# Patient Record
Sex: Male | Born: 1970
Health system: Southern US, Community
[De-identification: ages and names within clinical notes are randomized; demographics above are authoritative.]

## PROBLEM LIST (undated history)

## (undated) DIAGNOSIS — D693 Immune thrombocytopenic purpura: Secondary | ICD-10-CM

## (undated) DIAGNOSIS — T7840XA Allergy, unspecified, initial encounter: Secondary | ICD-10-CM

## (undated) DIAGNOSIS — C801 Malignant (primary) neoplasm, unspecified: Secondary | ICD-10-CM

## (undated) DIAGNOSIS — I10 Essential (primary) hypertension: Secondary | ICD-10-CM

## (undated) HISTORY — PX: DEEP NECK LYMPH NODE BIOPSY / EXCISION: SUR126

## (undated) HISTORY — PX: EXCISION OF TONGUE LESION: SHX6434

## (undated) HISTORY — DX: Immune thrombocytopenic purpura: D69.3

## (undated) HISTORY — DX: Essential (primary) hypertension: I10

## (undated) HISTORY — DX: Allergy, unspecified, initial encounter: T78.40XA

---

## 1998-09-07 DIAGNOSIS — D693 Immune thrombocytopenic purpura: Secondary | ICD-10-CM

## 1998-09-07 HISTORY — DX: Immune thrombocytopenic purpura: D69.3

## 2001-08-24 ENCOUNTER — Encounter: Admission: RE | Admit: 2001-08-24 | Discharge: 2001-08-24 | Payer: Self-pay | Admitting: Oncology

## 2001-08-24 ENCOUNTER — Encounter (HOSPITAL_COMMUNITY): Admission: RE | Admit: 2001-08-24 | Discharge: 2001-09-23 | Payer: Self-pay | Admitting: Oncology

## 2003-02-22 ENCOUNTER — Encounter (HOSPITAL_COMMUNITY): Admission: RE | Admit: 2003-02-22 | Discharge: 2003-03-24 | Payer: Self-pay | Admitting: Oncology

## 2003-02-22 ENCOUNTER — Encounter: Admission: RE | Admit: 2003-02-22 | Discharge: 2003-02-22 | Payer: Self-pay | Admitting: Oncology

## 2004-02-22 ENCOUNTER — Encounter: Admission: RE | Admit: 2004-02-22 | Discharge: 2004-02-22 | Payer: Self-pay | Admitting: Oncology

## 2004-02-22 ENCOUNTER — Encounter (HOSPITAL_COMMUNITY): Admission: RE | Admit: 2004-02-22 | Discharge: 2004-03-23 | Payer: Self-pay | Admitting: Oncology

## 2007-05-12 ENCOUNTER — Other Ambulatory Visit: Admission: RE | Admit: 2007-05-12 | Discharge: 2007-05-12 | Payer: Self-pay | Admitting: Urology

## 2007-05-12 ENCOUNTER — Encounter (INDEPENDENT_AMBULATORY_CARE_PROVIDER_SITE_OTHER): Payer: Self-pay | Admitting: Urology

## 2013-01-07 ENCOUNTER — Encounter: Payer: Self-pay | Admitting: *Deleted

## 2013-05-30 ENCOUNTER — Other Ambulatory Visit: Payer: Self-pay | Admitting: Family Medicine

## 2013-06-28 ENCOUNTER — Encounter: Payer: Self-pay | Admitting: Family Medicine

## 2013-06-28 ENCOUNTER — Ambulatory Visit (INDEPENDENT_AMBULATORY_CARE_PROVIDER_SITE_OTHER): Payer: BC Managed Care – PPO | Admitting: Family Medicine

## 2013-06-28 VITALS — BP 130/98 | Ht 78.0 in | Wt 227.0 lb

## 2013-06-28 DIAGNOSIS — Z23 Encounter for immunization: Secondary | ICD-10-CM

## 2013-06-28 DIAGNOSIS — Z0000 Encounter for general adult medical examination without abnormal findings: Secondary | ICD-10-CM

## 2013-06-28 DIAGNOSIS — F17200 Nicotine dependence, unspecified, uncomplicated: Secondary | ICD-10-CM

## 2013-06-28 DIAGNOSIS — I10 Essential (primary) hypertension: Secondary | ICD-10-CM

## 2013-06-28 DIAGNOSIS — E785 Hyperlipidemia, unspecified: Secondary | ICD-10-CM

## 2013-06-28 DIAGNOSIS — N529 Male erectile dysfunction, unspecified: Secondary | ICD-10-CM

## 2013-06-28 DIAGNOSIS — R739 Hyperglycemia, unspecified: Secondary | ICD-10-CM

## 2013-06-28 DIAGNOSIS — Z720 Tobacco use: Secondary | ICD-10-CM

## 2013-06-28 DIAGNOSIS — R7309 Other abnormal glucose: Secondary | ICD-10-CM

## 2013-06-28 LAB — BASIC METABOLIC PANEL
BUN: 13 mg/dL (ref 6–23)
CO2: 27 mEq/L (ref 19–32)
Glucose, Bld: 104 mg/dL — ABNORMAL HIGH (ref 70–99)
Potassium: 4.6 mEq/L (ref 3.5–5.3)
Sodium: 138 mEq/L (ref 135–145)

## 2013-06-28 LAB — HEPATIC FUNCTION PANEL
AST: 20 U/L (ref 0–37)
Bilirubin, Direct: 0.2 mg/dL (ref 0.0–0.3)
Indirect Bilirubin: 0.8 mg/dL (ref 0.0–0.9)
Total Bilirubin: 1 mg/dL (ref 0.3–1.2)

## 2013-06-28 LAB — LIPID PANEL: Total CHOL/HDL Ratio: 4.6 Ratio

## 2013-06-28 MED ORDER — SILDENAFIL CITRATE 100 MG PO TABS: 100.0000 mg | ORAL_TABLET | ORAL | Status: DC | PRN

## 2013-06-28 MED ORDER — LISINOPRIL 10 MG PO TABS: ORAL_TABLET | ORAL | Status: DC

## 2013-06-28 NOTE — Progress Notes (Signed)
  Subjective:    Patient ID: Christopher Buckley, male    DOB: 02/14/1971, 42 y.o.   MRN: [id number]  HPI Patient is here today for a physical.  He needs blood work done for his work physical.  He is unsure about getting the flu vaccine (he has never had it before). The patient comes in today for a wellness visit.  A review of their health history was completed.  A review of medications was also completed. Any necessary refills were discussed. Sensible healthy diet was discussed. Importance of minimizing excessive salt and carbohydrates was also discussed. Safety was stressed including driving, activities at work and at home where applicable. Importance of regular physical activity for overall health was discussed. Preventative measures appropriate for age were discussed. Time was spent with the patient discussing any concerns they have about their well-being.  No concerns.     Review of Systems  Constitutional: Negative for fever, activity change and appetite change.  HENT: Negative for congestion and rhinorrhea.   Eyes: Negative for discharge.  Respiratory: Negative for cough and wheezing.   Cardiovascular: Negative for chest pain.  Gastrointestinal: Negative for vomiting, abdominal pain and blood in stool.  Genitourinary: Negative for frequency and difficulty urinating.  Musculoskeletal: Negative for neck pain.  Skin: Negative for rash.  Allergic/Immunologic: Negative for environmental allergies and food allergies.  Neurological: Negative for weakness and headaches.  Psychiatric/Behavioral: Negative for agitation.       Objective:   Physical Exam  Constitutional: He appears well-developed and well-nourished.  HENT:  Head: Normocephalic and atraumatic.  Right Ear: External ear normal.  Left Ear: External ear normal.  Nose: Nose normal.  Mouth/Throat: Oropharynx is clear and moist.  Eyes: EOM are normal. Pupils are equal, round, and reactive to light.  Neck: Normal range  of motion. Neck supple. No thyromegaly present.  Cardiovascular: Normal rate, regular rhythm and normal heart sounds.   No murmur heard. Pulmonary/Chest: Effort normal and breath sounds normal. No respiratory distress. He has no wheezes.  Abdominal: Soft. Bowel sounds are normal. He exhibits no distension and no mass. There is no tenderness.  Genitourinary: Penis normal.  Musculoskeletal: Normal range of motion. He exhibits no edema.  Lymphadenopathy:    He has no cervical adenopathy.  Neurological: He is alert. He exhibits normal muscle tone.  Skin: Skin is warm and dry. No erythema.  Psychiatric: He has a normal mood and affect. His behavior is normal. Judgment normal.          Assessment & Plan:  #1 wellness-patient advised to quit smoking should he is encouraged to exercise on regular basis bring his weight down a little bit. Also keep taking his blood pressure medicine. #2 erectile dysfunction check testosterone level today. Also try Viagra #3 wellness labs ordered. #4 whitecoat hypertension his readings at home are very good. We will fill out his form accordingly. Best blood pressure reading here 130/92

## 2013-06-28 NOTE — Patient Instructions (Signed)
DASH Diet  The DASH diet stands for "Dietary Approaches to Stop Hypertension." It is a healthy eating plan that has been shown to reduce high blood pressure (hypertension) in as little as 14 days, while also possibly providing other significant health benefits. These other health benefits include reducing the risk of breast cancer after menopause and reducing the risk of type 2 diabetes, heart disease, colon cancer, and stroke. Health benefits also include weight loss and slowing kidney failure in patients with chronic kidney disease.   DIET GUIDELINES  · Limit salt (sodium). Your diet should contain less than 1500 mg of sodium daily.  · Limit refined or processed carbohydrates. Your diet should include mostly whole grains. Desserts and added sugars should be used sparingly.  · Include small amounts of heart-healthy fats. These types of fats include nuts, oils, and tub margarine. Limit saturated and trans fats. These fats have been shown to be harmful in the body.  CHOOSING FOODS   The following food groups are based on a 2000 calorie diet. See your Registered Dietitian for individual calorie needs.  Grains and Grain Products (6 to 8 servings daily)  · Eat More Often: Whole-wheat bread, brown rice, whole-grain or wheat pasta, quinoa, popcorn without added fat or salt (air popped).  · Eat Less Often: White bread, white pasta, white rice, cornbread.  Vegetables (4 to 5 servings daily)  · Eat More Often: Fresh, frozen, and canned vegetables. Vegetables may be raw, steamed, roasted, or grilled with a minimal amount of fat.  · Eat Less Often/Avoid: Creamed or fried vegetables. Vegetables in a cheese sauce.  Fruit (4 to 5 servings daily)  · Eat More Often: All fresh, canned (in natural juice), or frozen fruits. Dried fruits without added sugar. One hundred percent fruit juice (½ cup [237 mL] daily).  · Eat Less Often: Dried fruits with added sugar. Canned fruit in light or heavy syrup.  Lean Meats, Fish, and Poultry (2  servings or less daily. One serving is 3 to 4 oz [85-114 g]).  · Eat More Often: Ninety percent or leaner ground beef, tenderloin, sirloin. Round cuts of beef, chicken breast, turkey breast. All fish. Grill, bake, or broil your meat. Nothing should be fried.  · Eat Less Often/Avoid: Fatty cuts of meat, turkey, or chicken leg, thigh, or wing. Fried cuts of meat or fish.  Dairy (2 to 3 servings)  · Eat More Often: Low-fat or fat-free milk, low-fat plain or light yogurt, reduced-fat or part-skim cheese.  · Eat Less Often/Avoid: Milk (whole, 2%). Whole milk yogurt. Full-fat cheeses.  Nuts, Seeds, and Legumes (4 to 5 servings per week)  · Eat More Often: All without added salt.  · Eat Less Often/Avoid: Salted nuts and seeds, canned beans with added salt.  Fats and Sweets (limited)  · Eat More Often: Vegetable oils, tub margarines without trans fats, sugar-free gelatin. Mayonnaise and salad dressings.  · Eat Less Often/Avoid: Coconut oils, palm oils, butter, stick margarine, cream, half and half, cookies, candy, pie.  FOR MORE INFORMATION  The Dash Diet Eating Plan: www.dashdiet.org  Document Released: 08/13/2011 Document Revised: 11/16/2011 Document Reviewed: 08/13/2011  ExitCare® Patient Information ©2014 [employer].

## 2013-06-29 ENCOUNTER — Encounter: Payer: Self-pay | Admitting: Family Medicine

## 2014-06-08 ENCOUNTER — Other Ambulatory Visit: Payer: Self-pay | Admitting: Family Medicine

## 2014-06-26 ENCOUNTER — Encounter: Payer: Self-pay | Admitting: Family Medicine

## 2014-06-26 ENCOUNTER — Ambulatory Visit (INDEPENDENT_AMBULATORY_CARE_PROVIDER_SITE_OTHER): Payer: BC Managed Care – PPO | Admitting: Family Medicine

## 2014-06-26 VITALS — BP 140/90 | Ht 77.75 in | Wt 230.2 lb

## 2014-06-26 DIAGNOSIS — Z23 Encounter for immunization: Secondary | ICD-10-CM

## 2014-06-26 DIAGNOSIS — R7309 Other abnormal glucose: Secondary | ICD-10-CM

## 2014-06-26 DIAGNOSIS — E785 Hyperlipidemia, unspecified: Secondary | ICD-10-CM

## 2014-06-26 DIAGNOSIS — I10 Essential (primary) hypertension: Secondary | ICD-10-CM

## 2014-06-26 DIAGNOSIS — R7303 Prediabetes: Secondary | ICD-10-CM

## 2014-06-26 DIAGNOSIS — Z0000 Encounter for general adult medical examination without abnormal findings: Secondary | ICD-10-CM

## 2014-06-26 MED ORDER — LISINOPRIL 10 MG PO TABS: ORAL_TABLET | ORAL | Status: DC

## 2014-06-26 MED ORDER — SILDENAFIL CITRATE 100 MG PO TABS: ORAL_TABLET | ORAL | Status: DC

## 2014-06-26 NOTE — Patient Instructions (Signed)
Smoking Cessation Quitting smoking is important to your health and has many advantages. However, it is not always easy to quit since nicotine is a very addictive drug. Oftentimes, people try 3 times or more before being able to quit. This document explains the best ways for you to prepare to quit smoking. Quitting takes hard work and a lot of effort, but you can do it. ADVANTAGES OF QUITTING SMOKING  You will live longer, feel better, and live better.  Your body will feel the impact of quitting smoking almost immediately.  Within 20 minutes, blood pressure decreases. Your pulse returns to its normal level.  After 8 hours, carbon monoxide levels in the blood return to normal. Your oxygen level increases.  After 24 hours, the chance of having a heart attack starts to decrease. Your breath, hair, and body stop smelling like smoke.  After 48 hours, damaged nerve endings begin to recover. Your sense of taste and smell improve.  After 72 hours, the body is virtually free of nicotine. Your bronchial tubes relax and breathing becomes easier.  After 2 to 12 weeks, lungs can hold more air. Exercise becomes easier and circulation improves.  The risk of having a heart attack, stroke, cancer, or lung disease is greatly reduced.  After 1 year, the risk of coronary heart disease is cut in half.  After 5 years, the risk of stroke falls to the same as a nonsmoker.  After 10 years, the risk of lung cancer is cut in half and the risk of other cancers decreases significantly.  After 15 years, the risk of coronary heart disease drops, usually to the level of a nonsmoker.  If you are pregnant, quitting smoking will improve your chances of having a healthy baby.  The people you live with, especially any children, will be healthier.  You will have extra money to spend on things other than cigarettes. QUESTIONS TO THINK ABOUT BEFORE ATTEMPTING TO QUIT You may want to talk about your answers with your  health care provider.  Why do you want to quit?  If you tried to quit in the past, what helped and what did not?  What will be the most difficult situations for you after you quit? How will you plan to handle them?  Who can help you through the tough times? Your family? Friends? A health care provider?  What pleasures do you get from smoking? What ways can you still get pleasure if you quit? Here are some questions to ask your health care provider:  How can you help me to be successful at quitting?  What medicine do you think would be best for me and how should I take it?  What should I do if I need more help?  What is smoking withdrawal like? How can I get information on withdrawal? GET READY  Set a quit date.  Change your environment by getting rid of all cigarettes, ashtrays, matches, and lighters in your home, car, or work. Do not let people smoke in your home.  Review your past attempts to quit. Think about what worked and what did not. GET SUPPORT AND ENCOURAGEMENT You have a better chance of being successful if you have help. You can get support in many ways.  Tell your family, friends, and coworkers that you are going to quit and need their support. Ask them not to smoke around you.  Get individual, group, or telephone counseling and support. Programs are available at local hospitals and health centers. Call   your local health department for information about programs in your area. °· Spiritual beliefs and practices may help some smokers quit. °· Download a "quit meter" on your computer to keep track of quit statistics, such as how long you have gone without smoking, cigarettes not smoked, and money saved. °· Get a self-help book about quitting smoking and staying off tobacco. °LEARN NEW SKILLS AND BEHAVIORS °· Distract yourself from urges to smoke. Talk to someone, go for a walk, or occupy your time with a task. °· Change your normal routine. Take a different route to work.  Drink tea instead of coffee. Eat breakfast in a different place. °· Reduce your stress. Take a hot bath, exercise, or read a book. °· Plan something enjoyable to do every day. Reward yourself for not smoking. °· Explore interactive web-based programs that specialize in helping you quit. °GET MEDICINE AND USE IT CORRECTLY °Medicines can help you stop smoking and decrease the urge to smoke. Combining medicine with the above behavioral methods and support can greatly increase your chances of successfully quitting smoking. °· Nicotine replacement therapy helps deliver nicotine to your body without the negative effects and risks of smoking. Nicotine replacement therapy includes nicotine gum, lozenges, inhalers, nasal sprays, and skin patches. Some may be available over-the-counter and others require a prescription. °· Antidepressant medicine helps people abstain from smoking, but how this works is unknown. This medicine is available by prescription. °· Nicotinic receptor partial agonist medicine simulates the effect of nicotine in your brain. This medicine is available by prescription. °Ask your health care provider for advice about which medicines to use and how to use them based on your health history. Your health care provider will tell you what side effects to look out for if you choose to be on a medicine or therapy. Carefully read the information on the package. Do not use any other product containing nicotine while using a nicotine replacement product.  °RELAPSE OR DIFFICULT SITUATIONS °Most relapses occur within the first 3 months after quitting. Do not be discouraged if you start smoking again. Remember, most people try several times before finally quitting. You may have symptoms of withdrawal because your body is used to nicotine. You may crave cigarettes, be irritable, feel very hungry, cough often, get headaches, or have difficulty concentrating. The withdrawal symptoms are only temporary. They are strongest  when you first quit, but they will go away within 10-14 days. °To reduce the chances of relapse, try to: °· Avoid drinking alcohol. Drinking lowers your chances of successfully quitting. °· Reduce the amount of caffeine you consume. Once you quit smoking, the amount of caffeine in your body increases and can give you symptoms, such as a rapid heartbeat, sweating, and anxiety. °· Avoid smokers because they can make you want to smoke. °· Do not let weight gain distract you. Many smokers will gain weight when they quit, usually less than 10 pounds. Eat a healthy diet and stay active. You can always lose the weight gained after you quit. °· Find ways to improve your mood other than smoking. °FOR MORE INFORMATION  °www.smokefree.gov  °Document Released: 08/18/2001 Document Revised: 01/08/2014 Document Reviewed: 12/03/2011 °ExitCare® Patient Information ©2015 [employer]. This information is not intended to replace advice given to you by your health care provider. Make sure you discuss any questions you have with your health care provider. °Hypertension °Hypertension, commonly called high blood pressure, is when the force of blood pumping through your arteries is too strong. Your arteries   are the blood vessels that carry blood from your heart throughout your body. A blood pressure reading consists of a higher number over a lower number, such as 110/72. The higher number (systolic) is the pressure inside your arteries when your heart pumps. The lower number (diastolic) is the pressure inside your arteries when your heart relaxes. Ideally you want your blood pressure below 120/80. °Hypertension forces your heart to work harder to pump blood. Your arteries may become narrow or stiff. Having hypertension puts you at risk for heart disease, stroke, and other problems.  °RISK FACTORS °Some risk factors for high blood pressure are controllable. Others are not.  °Risk factors you cannot control include:  °· Race. You may be at  higher risk if you are African American. °· Age. Risk increases with age. °· Gender. Men are at higher risk than women before age 45 years. After age 65, women are at higher risk than men. °Risk factors you can control include: °· Not getting enough exercise or physical activity. °· Being overweight. °· Getting too much fat, sugar, calories, or salt in your diet. °· Drinking too much alcohol. °SIGNS AND SYMPTOMS °Hypertension does not usually cause signs or symptoms. Extremely high blood pressure (hypertensive crisis) may cause headache, anxiety, shortness of breath, and nosebleed. °DIAGNOSIS  °To check if you have hypertension, your health care provider will measure your blood pressure while you are seated, with your arm held at the level of your heart. It should be measured at least twice using the same arm. Certain conditions can cause a difference in blood pressure between your right and left arms. A blood pressure reading that is higher than normal on one occasion does not mean that you need treatment. If one blood pressure reading is high, ask your health care provider about having it checked again. °TREATMENT  °Treating high blood pressure includes making lifestyle changes and possibly taking medicine. Living a healthy lifestyle can help lower high blood pressure. You may need to change some of your habits. °Lifestyle changes may include: °· Following the DASH diet. This diet is high in fruits, vegetables, and whole grains. It is low in salt, red meat, and added sugars. °· Getting at least 2½ hours of brisk physical activity every week. °· Losing weight if necessary. °· Not smoking. °· Limiting alcoholic beverages. °· Learning ways to reduce stress. ° If lifestyle changes are not enough to get your blood pressure under control, your health care provider may prescribe medicine. You may need to take more than one. Work closely with your health care provider to understand the risks and benefits. °HOME CARE  INSTRUCTIONS °· Have your blood pressure rechecked as directed by your health care provider.   °· Take medicines only as directed by your health care provider. Follow the directions carefully. Blood pressure medicines must be taken as prescribed. The medicine does not work as well when you skip doses. Skipping doses also puts you at risk for problems.   °· Do not smoke.   °· Monitor your blood pressure at home as directed by your health care provider.  °SEEK MEDICAL CARE IF:  °· You think you are having a reaction to medicines taken. °· You have recurrent headaches or feel dizzy. °· You have swelling in your ankles. °· You have trouble with your vision. °SEEK IMMEDIATE MEDICAL CARE IF: °· You develop a severe headache or confusion. °· You have unusual weakness, numbness, or feel faint. °· You have severe chest or abdominal pain. °· You vomit repeatedly. °·   You have trouble breathing. MAKE SURE YOU:   Understand these instructions.  Will watch your condition.  Will get help right away if you are not doing well or get worse. Document Released: 08/24/2005 Document Revised: 01/08/2014 Document Reviewed: 06/16/2013 ExitCare Patient Information 2015 [employer]. This information is not intended to replace advice given to you by your health care provider. Make sure you discuss any questions you have with your health care provider. DASH Eating Plan DASH stands for "Dietary Approaches to Stop Hypertension." The DASH eating plan is a healthy eating plan that has been shown to reduce high blood pressure (hypertension). Additional health benefits may include reducing the risk of type 2 diabetes mellitus, heart disease, and stroke. The DASH eating plan may also help with weight loss. WHAT DO I NEED TO KNOW ABOUT THE DASH EATING PLAN? For the DASH eating plan, you will follow these general guidelines:  Choose foods with a percent daily value for sodium of less than 5% (as listed on the food label).  Use  salt-free seasonings or herbs instead of table salt or sea salt.  Check with your health care provider or pharmacist before using salt substitutes.  Eat lower-sodium products, often labeled as "lower sodium" or "no salt added."  Eat fresh foods.  Eat more vegetables, fruits, and low-fat dairy products.  Choose whole grains. Look for the word "whole" as the first word in the ingredient list.  Choose fish and skinless chicken or turkey more often than red meat. Limit fish, poultry, and meat to 6 oz (170 g) each day.  Limit sweets, desserts, sugars, and sugary drinks.  Choose heart-healthy fats.  Limit cheese to 1 oz (28 g) per day.  Eat more home-cooked food and less restaurant, buffet, and fast food.  Limit fried foods.  Cook foods using methods other than frying.  Limit canned vegetables. If you do use them, rinse them well to decrease the sodium.  When eating at a restaurant, ask that your food be prepared with less salt, or no salt if possible. WHAT FOODS CAN I EAT? Seek help from a dietitian for individual calorie needs. Grains Whole grain or whole wheat bread. Brown rice. Whole grain or whole wheat pasta. Quinoa, bulgur, and whole grain cereals. Low-sodium cereals. Corn or whole wheat flour tortillas. Whole grain cornbread. Whole grain crackers. Low-sodium crackers. Vegetables Fresh or frozen vegetables (raw, steamed, roasted, or grilled). Low-sodium or reduced-sodium tomato and vegetable juices. Low-sodium or reduced-sodium tomato sauce and paste. Low-sodium or reduced-sodium canned vegetables.  Fruits All fresh, canned (in natural juice), or frozen fruits. Meat and Other Protein Products Ground beef (85% or leaner), grass-fed beef, or beef trimmed of fat. Skinless chicken or turkey. Ground chicken or turkey. Pork trimmed of fat. All fish and seafood. Eggs. Dried beans, peas, or lentils. Unsalted nuts and seeds. Unsalted canned beans. Dairy Low-fat dairy products, such as  skim or 1% milk, 2% or reduced-fat cheeses, low-fat ricotta or cottage cheese, or plain low-fat yogurt. Low-sodium or reduced-sodium cheeses. Fats and Oils Tub margarines without trans fats. Light or reduced-fat mayonnaise and salad dressings (reduced sodium). Avocado. Safflower, olive, or canola oils. Natural peanut or almond butter. Other Unsalted popcorn and pretzels. The items listed above may not be a complete list of recommended foods or beverages. Contact your dietitian for more options. WHAT FOODS ARE NOT RECOMMENDED? Grains White bread. White pasta. White rice. Refined cornbread. Bagels and croissants. Crackers that contain trans fat. Vegetables Creamed or fried vegetables. Vegetables in   a cheese sauce. Regular canned vegetables. Regular canned tomato sauce and paste. Regular tomato and vegetable juices. Fruits Dried fruits. Canned fruit in light or heavy syrup. Fruit juice. Meat and Other Protein Products Fatty cuts of meat. Ribs, chicken wings, bacon, sausage, bologna, salami, chitterlings, fatback, hot dogs, bratwurst, and packaged luncheon meats. Salted nuts and seeds. Canned beans with salt. Dairy Whole or 2% milk, cream, half-and-half, and cream cheese. Whole-fat or sweetened yogurt. Full-fat cheeses or blue cheese. Nondairy creamers and whipped toppings. Processed cheese, cheese spreads, or cheese curds. Condiments Onion and garlic salt, seasoned salt, table salt, and sea salt. Canned and packaged gravies. Worcestershire sauce. Tartar sauce. Barbecue sauce. Teriyaki sauce. Soy sauce, including reduced sodium. Steak sauce. Fish sauce. Oyster sauce. Cocktail sauce. Horseradish. Ketchup and mustard. Meat flavorings and tenderizers. Bouillon cubes. Hot sauce. Tabasco sauce. Marinades. Taco seasonings. Relishes. Fats and Oils Butter, stick margarine, lard, shortening, ghee, and bacon fat. Coconut, palm kernel, or palm oils. Regular salad dressings. Other Pickles and olives. Salted  popcorn and pretzels. The items listed above may not be a complete list of foods and beverages to avoid. Contact your dietitian for more information. WHERE CAN I FIND MORE INFORMATION? National Heart, Lung, and [employer] Institute: www.nhlbi.nih.gov/health/health-topics/topics/dash/ Document Released: 08/13/2011 Document Revised: 01/08/2014 Document Reviewed: 06/28/2013 ExitCare Patient Information 2015 [employer]. This information is not intended to replace advice given to you by your health care provider. Make sure you discuss any questions you have with your health care provider.  

## 2014-06-26 NOTE — Progress Notes (Signed)
   Subjective:    Patient ID: Christopher Buckley, male    DOB: 02-14-1971, 43 y.o.   MRN: [id number]  HPI The patient comes in today for a wellness visit.    A review of their health history was completed.  A review of medications was also completed.  Any needed refills: yes Lisinopril  Eating habits: Decent  Falls/  MVA accidents in past few months: none  Regular exercise: Patient is very active.   Specialist pt sees on regular basis: none  Preventative health issues were discussed.   Additional concerns: none Patient does have a history of hypertension he tries watch salt in his diet exercises some but he knows he needs to quit smoking. He does try to be careful about starches and sugars in his diet.  Review of Systems  Constitutional: Negative for fever, activity change and appetite change.  HENT: Negative for congestion and rhinorrhea.   Eyes: Negative for discharge.  Respiratory: Negative for cough and wheezing.   Cardiovascular: Negative for chest pain.  Gastrointestinal: Negative for vomiting, abdominal pain and blood in stool.  Genitourinary: Negative for frequency and difficulty urinating.  Musculoskeletal: Negative for neck pain.  Skin: Negative for rash.  Allergic/Immunologic: Negative for environmental allergies and food allergies.  Neurological: Negative for weakness and headaches.  Psychiatric/Behavioral: Negative for agitation.       Objective:   Physical Exam  Constitutional: He appears well-developed and well-nourished.  HENT:  Head: Normocephalic and atraumatic.  Right Ear: External ear normal.  Left Ear: External ear normal.  Nose: Nose normal.  Mouth/Throat: Oropharynx is clear and moist.  Eyes: EOM are normal. Pupils are equal, round, and reactive to light.  Neck: Normal range of motion. Neck supple. No thyromegaly present.  Cardiovascular: Normal rate, regular rhythm and normal heart sounds.   No murmur heard. Pulmonary/Chest: Effort normal  and breath sounds normal. No respiratory distress. He has no wheezes.  Abdominal: Soft. Bowel sounds are normal. He exhibits no distension and no mass. There is no tenderness.  Genitourinary: Penis normal.  Musculoskeletal: Normal range of motion. He exhibits no edema.  Lymphadenopathy:    He has no cervical adenopathy.  Neurological: He is alert. He exhibits normal muscle tone.  Skin: Skin is warm and dry. No erythema.  Psychiatric: He has a normal mood and affect. His behavior is normal. Judgment normal.          Assessment & Plan:  Wellness exam safety measures dietary measures all discussed Patient was counseled to quit smoking Blood pressure good control continue current medication History of mild hyperlipidemia check lipid profile Dietary measures discussing preventing cholesterol and diabetes was discussed Prediabetes check A1c await results.

## 2014-07-03 LAB — BASIC METABOLIC PANEL
BUN: 17 mg/dL (ref 6–23)
CALCIUM: 8.9 mg/dL (ref 8.4–10.5)
CO2: 26 mEq/L (ref 19–32)
Chloride: 105 mEq/L (ref 96–112)
Creat: 1.02 mg/dL (ref 0.50–1.35)
GLUCOSE: 106 mg/dL — AB (ref 70–99)
Potassium: 4.5 mEq/L (ref 3.5–5.3)
SODIUM: 139 meq/L (ref 135–145)

## 2014-07-03 LAB — HEPATIC FUNCTION PANEL
ALT: 25 U/L (ref 0–53)
AST: 14 U/L (ref 0–37)
Albumin: 4.2 g/dL (ref 3.5–5.2)
Alkaline Phosphatase: 62 U/L (ref 39–117)
BILIRUBIN DIRECT: 0.2 mg/dL (ref 0.0–0.3)
BILIRUBIN TOTAL: 0.9 mg/dL (ref 0.2–1.2)
Indirect Bilirubin: 0.7 mg/dL (ref 0.2–1.2)
Total Protein: 6.3 g/dL (ref 6.0–8.3)

## 2014-07-03 LAB — LIPID PANEL
CHOL/HDL RATIO: 4.9 ratio
Cholesterol: 167 mg/dL (ref 0–200)
HDL: 34 mg/dL — ABNORMAL LOW (ref >39)
LDL Cholesterol: 103 mg/dL — ABNORMAL HIGH (ref 0–99)
Triglycerides: 151 mg/dL — ABNORMAL HIGH (ref <150)
VLDL: 30 mg/dL (ref 0–40)

## 2014-07-03 LAB — HEMOGLOBIN A1C
Hgb A1c MFr Bld: 5.8 % — ABNORMAL HIGH (ref <5.7)
Mean Plasma Glucose: 120 mg/dL — ABNORMAL HIGH (ref <117)

## 2014-07-04 ENCOUNTER — Telehealth: Payer: Self-pay | Admitting: *Deleted

## 2014-07-04 NOTE — Telephone Encounter (Signed)
Wellness form filled out and faxed to [phone]. Pt also wanted copy mailed. Copy mailed to pt and copy to scan.

## 2014-07-25 ENCOUNTER — Other Ambulatory Visit: Payer: Self-pay | Admitting: *Deleted

## 2014-07-25 MED ORDER — TADALAFIL 10 MG PO TABS: ORAL_TABLET | ORAL | Status: DC

## 2015-06-21 ENCOUNTER — Encounter: Payer: Self-pay | Admitting: Family Medicine

## 2015-06-21 ENCOUNTER — Ambulatory Visit (INDEPENDENT_AMBULATORY_CARE_PROVIDER_SITE_OTHER): Payer: BLUE CROSS/BLUE SHIELD | Admitting: Family Medicine

## 2015-06-21 VITALS — BP 144/96 | Ht 77.25 in | Wt 243.0 lb

## 2015-06-21 DIAGNOSIS — Z0000 Encounter for general adult medical examination without abnormal findings: Secondary | ICD-10-CM

## 2015-06-21 DIAGNOSIS — Z23 Encounter for immunization: Secondary | ICD-10-CM

## 2015-06-21 NOTE — Patient Instructions (Signed)
DASH Eating Plan  DASH stands for "Dietary Approaches to Stop Hypertension." The DASH eating plan is a healthy eating plan that has been shown to reduce high blood pressure (hypertension). Additional health benefits may include reducing the risk of type 2 diabetes mellitus, heart disease, and stroke. The DASH eating plan may also help with weight loss.  WHAT DO I NEED TO KNOW ABOUT THE DASH EATING PLAN?  For the DASH eating plan, you will follow these general guidelines:  · Choose foods with a percent daily value for sodium of less than 5% (as listed on the food label).  · Use salt-free seasonings or herbs instead of table salt or sea salt.  · Check with your health care provider or pharmacist before using salt substitutes.  · Eat lower-sodium products, often labeled as "lower sodium" or "no salt added."  · Eat fresh foods.  · Eat more vegetables, fruits, and low-fat dairy products.  · Choose whole grains. Look for the word "whole" as the first word in the ingredient list.  · Choose fish and skinless chicken or turkey more often than red meat. Limit fish, poultry, and meat to 6 oz (170 g) each day.  · Limit sweets, desserts, sugars, and sugary drinks.  · Choose heart-healthy fats.  · Limit cheese to 1 oz (28 g) per day.  · Eat more home-cooked food and less restaurant, buffet, and fast food.  · Limit fried foods.  · Cook foods using methods other than frying.  · Limit canned vegetables. If you do use them, rinse them well to decrease the sodium.  · When eating at a restaurant, ask that your food be prepared with less salt, or no salt if possible.  WHAT FOODS CAN I EAT?  Seek help from a dietitian for individual calorie needs.  Grains  Whole grain or whole wheat bread. Brown rice. Whole grain or whole wheat pasta. Quinoa, bulgur, and whole grain cereals. Low-sodium cereals. Corn or whole wheat flour tortillas. Whole grain cornbread. Whole grain crackers. Low-sodium crackers.  Vegetables  Fresh or frozen vegetables  (raw, steamed, roasted, or grilled). Low-sodium or reduced-sodium tomato and vegetable juices. Low-sodium or reduced-sodium tomato sauce and paste. Low-sodium or reduced-sodium canned vegetables.   Fruits  All fresh, canned (in natural juice), or frozen fruits.  Meat and Other Protein Products  Ground beef (85% or leaner), grass-fed beef, or beef trimmed of fat. Skinless chicken or turkey. Ground chicken or turkey. Pork trimmed of fat. All fish and seafood. Eggs. Dried beans, peas, or lentils. Unsalted nuts and seeds. Unsalted canned beans.  Dairy  Low-fat dairy products, such as skim or 1% milk, 2% or reduced-fat cheeses, low-fat ricotta or cottage cheese, or plain low-fat yogurt. Low-sodium or reduced-sodium cheeses.  Fats and Oils  Tub margarines without trans fats. Light or reduced-fat mayonnaise and salad dressings (reduced sodium). Avocado. Safflower, olive, or canola oils. Natural peanut or almond butter.  Other  Unsalted popcorn and pretzels.  The items listed above may not be a complete list of recommended foods or beverages. Contact your dietitian for more options.  WHAT FOODS ARE NOT RECOMMENDED?  Grains  White bread. White pasta. White rice. Refined cornbread. Bagels and croissants. Crackers that contain trans fat.  Vegetables  Creamed or fried vegetables. Vegetables in a cheese sauce. Regular canned vegetables. Regular canned tomato sauce and paste. Regular tomato and vegetable juices.  Fruits  Dried fruits. Canned fruit in light or heavy syrup. Fruit juice.  Meat and Other Protein   Products  Fatty cuts of meat. Ribs, chicken wings, bacon, sausage, bologna, salami, chitterlings, fatback, hot dogs, bratwurst, and packaged luncheon meats. Salted nuts and seeds. Canned beans with salt.  Dairy  Whole or 2% milk, cream, half-and-half, and cream cheese. Whole-fat or sweetened yogurt. Full-fat cheeses or blue cheese. Nondairy creamers and whipped toppings. Processed cheese, cheese spreads, or cheese  curds.  Condiments  Onion and garlic salt, seasoned salt, table salt, and sea salt. Canned and packaged gravies. Worcestershire sauce. Tartar sauce. Barbecue sauce. Teriyaki sauce. Soy sauce, including reduced sodium. Steak sauce. Fish sauce. Oyster sauce. Cocktail sauce. Horseradish. Ketchup and mustard. Meat flavorings and tenderizers. Bouillon cubes. Hot sauce. Tabasco sauce. Marinades. Taco seasonings. Relishes.  Fats and Oils  Butter, stick margarine, lard, shortening, ghee, and bacon fat. Coconut, palm kernel, or palm oils. Regular salad dressings.  Other  Pickles and olives. Salted popcorn and pretzels.  The items listed above may not be a complete list of foods and beverages to avoid. Contact your dietitian for more information.  WHERE CAN I FIND MORE INFORMATION?  National Heart, Lung, and [employer] Institute: www.nhlbi.nih.gov/health/health-topics/topics/dash/     This information is not intended to replace advice given to you by your health care provider. Make sure you discuss any questions you have with your health care provider.     Document Released: 08/13/2011 Document Revised: 09/14/2014 Document Reviewed: 06/28/2013  Elsevier Interactive Patient Education ©2016 [employer]

## 2015-06-21 NOTE — Progress Notes (Signed)
   Subjective:    Patient ID: Christopher Buckley, male    DOB: Jan 27, 1971, 44 y.o.   MRN: [id number]  HPI The patient comes in today for a wellness visit.    A review of their health history was completed.  A review of medications was also completed.  Any needed refills; yes - lisinopril  Eating habits: health conscious  Falls/  MVA accidents in past few months: none  Regular exercise: walking at work  Specialist pt sees on regular basis: none  Preventative health issues were discussed.   Additional concerns: none  Flu vaccine today     Review of Systems  Constitutional: Negative for fever, activity change and appetite change.  HENT: Negative for congestion and rhinorrhea.   Eyes: Negative for discharge.  Respiratory: Negative for cough and wheezing.   Cardiovascular: Negative for chest pain.  Gastrointestinal: Negative for vomiting, abdominal pain and blood in stool.  Genitourinary: Negative for frequency and difficulty urinating.  Musculoskeletal: Negative for neck pain.  Skin: Negative for rash.  Allergic/Immunologic: Negative for environmental allergies and food allergies.  Neurological: Negative for weakness and headaches.  Psychiatric/Behavioral: Negative for agitation.       Objective:   Physical Exam  Constitutional: He appears well-developed and well-nourished.  HENT:  Head: Normocephalic and atraumatic.  Right Ear: External ear normal.  Left Ear: External ear normal.  Nose: Nose normal.  Mouth/Throat: Oropharynx is clear and moist.  Eyes: EOM are normal. Pupils are equal, round, and reactive to light.  Neck: Normal range of motion. Neck supple. No thyromegaly present.  Cardiovascular: Normal rate, regular rhythm and normal heart sounds.   No murmur heard. Pulmonary/Chest: Effort normal and breath sounds normal. No respiratory distress. He has no wheezes.  Abdominal: Soft. Bowel sounds are normal. He exhibits no distension and no mass. There is no  tenderness.  Genitourinary: Penis normal.  Musculoskeletal: Normal range of motion. He exhibits no edema.  Lymphadenopathy:    He has no cervical adenopathy.  Neurological: He is alert. He exhibits normal muscle tone.  Skin: Skin is warm and dry. No erythema.  Psychiatric: He has a normal mood and affect. His behavior is normal. Judgment normal.          Assessment & Plan:  I am concerned about this patient's blood pressure I renewed his medicine but I recommended that he follow-up within 4-6 weeks to recheck his blood pressure may need adjusting  Safety dietary measures discussed. Patient is supposed to get his wellness lab work through his workplace and he will send it to [place]  Patient has quit smoking he is encouraged to continue that    Regular exercise recommended.

## 2015-06-28 ENCOUNTER — Encounter: Payer: Self-pay | Admitting: Family Medicine

## 2015-07-09 ENCOUNTER — Other Ambulatory Visit: Payer: Self-pay | Admitting: Family Medicine

## 2015-07-11 ENCOUNTER — Telehealth: Payer: Self-pay | Admitting: Family Medicine

## 2015-07-11 DIAGNOSIS — E785 Hyperlipidemia, unspecified: Secondary | ICD-10-CM

## 2015-07-11 NOTE — Telephone Encounter (Signed)
Please let patient know that patient has significant elevation of cholesterol. Also the combination of slightly low good cholesterol and significant elevation of the bad cholesterol puts him at a increased risk for heart disease. His risk of a heart attack in the next 10 years is 16%. This means if I had 100 people just like him and we did not treat the cholesterol problem then 16 out of 100 would have a heart attack in the next 10 years. This is not good. I highly recommend going on a cholesterol medication to get the bad cholesterol under 100. I would recommend Lipitor 10 mg 1 daily, #30, 5 refills, repeat lipid liver profile in approximately 8-12 weeks with follow-up in approximately 3 months to see how he is doing. I also recommend 81 mg aspirin 1 daily as long as it does not cause stomach upset. Very important to keep blood pressure under control, eat healthy, stay active, no smoking.

## 2015-07-11 NOTE — Telephone Encounter (Signed)
Wife dropped off her husbands lab results. Pt fasted. Message in box.

## 2015-07-12 NOTE — Telephone Encounter (Signed)
[facility]

## 2015-07-18 NOTE — Telephone Encounter (Signed)
Called and spoke with patient and informed him per Dr.Scott Luking-Please let patient know that patient has significant elevation of cholesterol. Also the combination of slightly low good cholesterol and significant elevation of the bad cholesterol puts him at a increased risk for heart disease. His risk of a heart attack in the next 10 years is 16%. This means if I had 100 people just like him and we did not treat the cholesterol problem then 16 out of 100 would have a heart attack in the next 10 years. This is not good. I highly recommend going on a cholesterol medication to get the bad cholesterol under 100. I would recommend Lipitor 10 mg 1 daily, #30, 5 refills, repeat lipid liver profile in approximately 8-12 weeks with follow-up in approximately 3 months to see how he is doing. I also recommend 81 mg aspirin 1 daily as long as it does not cause stomach upset. Very important to keep blood pressure under control, eat healthy, stay active, no smoking. Patient verbalized understanding and stated that he prefers to wait until office visit to discuss with Dr.Scott face to face lab results because he does not trust the Lab in which he used. Discussed this in real time with Dr.Scott and received a verbal order for lipid panel for patient to do at [facility]. Patient verbalized understanding and stated he would repeat the lab results at [facility].

## 2015-07-23 ENCOUNTER — Encounter: Payer: Self-pay | Admitting: Family Medicine

## 2015-07-23 ENCOUNTER — Ambulatory Visit (INDEPENDENT_AMBULATORY_CARE_PROVIDER_SITE_OTHER): Payer: BLUE CROSS/BLUE SHIELD | Admitting: Family Medicine

## 2015-07-23 VITALS — BP 132/78 | Ht 77.25 in | Wt 241.5 lb

## 2015-07-23 DIAGNOSIS — I10 Essential (primary) hypertension: Secondary | ICD-10-CM | POA: Diagnosis not present

## 2015-07-23 DIAGNOSIS — E785 Hyperlipidemia, unspecified: Secondary | ICD-10-CM

## 2015-07-23 NOTE — Progress Notes (Signed)
   Subjective:    Patient ID: Christopher Buckley, male    DOB: 1970-12-27, 44 y.o.   MRN: [id number]  Hypertension This is a chronic problem. The current episode started more than 1 year ago. The problem has been gradually improving since onset. The problem is controlled. There are no associated agents to hypertension. There are no known risk factors for coronary artery disease. Treatments tried: lisinopril. The current treatment provides moderate improvement. There are no compliance problems.    Patient states he has no concerns at this time.   Patient states overall energy level doing fairly well he is trying do much better watching how he eats. He is trying to lose excess amount of fat in the diet. He is trying to lose some weight is well doing some moderate exercise trying to take on less stress.   Review of Systems Patient denies any chest tightness pressure pain shortness breath swelling in legs nausea vomiting or diarrhea    Objective:   Physical Exam  Lungs are clear hearts regular pulse normal extremities no edema blood pressure recheck very good I informed the patient that it appeared to be a data entry air or that stated that he was at significant higher risk of heart disease his risk is slightly elevated but he is very motivated to try to get cholesterol under better control through dietary measures does not one to be on medication currently.     Assessment & Plan:  Hypertension actually very good control today no adjustments necessary watch diet stay physically active  Hyperlipidemia patient doing better with watching diet recommend patient to recheck lipid profile in approximately 8 weeks at this point in time does not need medication.  The patient's blood pressure does well we will follow-up in the fall otherwise if any issues or problems with that or his cholesterol will follow-up in May 2016

## 2016-01-11 ENCOUNTER — Other Ambulatory Visit: Payer: Self-pay | Admitting: Family Medicine

## 2016-02-14 ENCOUNTER — Other Ambulatory Visit: Payer: Self-pay | Admitting: Family Medicine

## 2016-02-14 MED ORDER — LISINOPRIL 10 MG PO TABS: 10.0000 mg | ORAL_TABLET | Freq: Every day | ORAL | Status: DC

## 2016-02-14 NOTE — Telephone Encounter (Signed)
Pt is needing a refill on his lisinopril (PRINIVIL,ZESTRIL) 10 MG tablet. Pt has four tablets left and is going out of town tomorrow.       [facility]

## 2016-02-14 NOTE — Telephone Encounter (Signed)
Notified patient refill sent to pharmacy. Needs office visit.

## 2016-02-25 ENCOUNTER — Ambulatory Visit (INDEPENDENT_AMBULATORY_CARE_PROVIDER_SITE_OTHER): Payer: BLUE CROSS/BLUE SHIELD | Admitting: Family Medicine

## 2016-02-25 ENCOUNTER — Encounter: Payer: Self-pay | Admitting: Family Medicine

## 2016-02-25 VITALS — BP 122/86 | Ht 77.25 in | Wt 242.0 lb

## 2016-02-25 DIAGNOSIS — R739 Hyperglycemia, unspecified: Secondary | ICD-10-CM | POA: Diagnosis not present

## 2016-02-25 DIAGNOSIS — E785 Hyperlipidemia, unspecified: Secondary | ICD-10-CM | POA: Diagnosis not present

## 2016-02-25 DIAGNOSIS — Z79899 Other long term (current) drug therapy: Secondary | ICD-10-CM

## 2016-02-25 DIAGNOSIS — I10 Essential (primary) hypertension: Secondary | ICD-10-CM

## 2016-02-25 MED ORDER — LISINOPRIL 10 MG PO TABS: 10.0000 mg | ORAL_TABLET | Freq: Every day | ORAL | Status: DC

## 2016-02-25 NOTE — Progress Notes (Signed)
   Subjective:    Patient ID: Christopher Buckley, male    DOB: 1970/12/19, 45 y.o.   MRN: [id number]  Hypertension This is a chronic problem. There are no compliance problems (lisinopril).    Pt states no concerns today.  Patient denies any chest tightness pressure pain shortness of breath does not smoke tries the healthy stay physically active but not necessarily exercising  Review of Systems    see above Objective:   Physical Exam  nearLungs clear heart regular pulse normal extremities no edema skin warm dry      Assessment & Plan:  History hyperlipidemia recent labs reviewed check lipid profile liver profile as well History of prediabetes paced states he's doing better on diet check A1c Blood pressure good control continue current measures check metabolic 7 follow-up 6 months

## 2016-06-21 LAB — LIPID PANEL
Chol/HDL Ratio: 5.4 ratio units — ABNORMAL HIGH (ref 0.0–5.0)
Cholesterol, Total: 188 mg/dL (ref 100–199)
HDL: 35 mg/dL — ABNORMAL LOW (ref >39)
LDL Calculated: 94 mg/dL (ref 0–99)
Triglycerides: 296 mg/dL — ABNORMAL HIGH (ref 0–149)
VLDL CHOLESTEROL CAL: 59 mg/dL — AB (ref 5–40)

## 2016-06-21 LAB — BASIC METABOLIC PANEL
BUN/Creatinine Ratio: 12 (ref 9–20)
BUN: 11 mg/dL (ref 6–24)
CO2: 24 mmol/L (ref 18–29)
CREATININE: 0.94 mg/dL (ref 0.76–1.27)
Calcium: 9.5 mg/dL (ref 8.7–10.2)
Chloride: 103 mmol/L (ref 96–106)
GFR, EST AFRICAN AMERICAN: 113 mL/min/{1.73_m2} (ref >59)
GFR, EST NON AFRICAN AMERICAN: 98 mL/min/{1.73_m2} (ref >59)
Glucose: 115 mg/dL — ABNORMAL HIGH (ref 65–99)
Potassium: 4.5 mmol/L (ref 3.5–5.2)
SODIUM: 142 mmol/L (ref 134–144)

## 2016-06-21 LAB — HEMOGLOBIN A1C
ESTIMATED AVERAGE GLUCOSE: 111 mg/dL
Hgb A1c MFr Bld: 5.5 % (ref 4.8–5.6)

## 2016-06-21 LAB — HEPATIC FUNCTION PANEL
ALBUMIN: 4.5 g/dL (ref 3.5–5.5)
ALK PHOS: 74 IU/L (ref 39–117)
ALT: 34 IU/L (ref 0–44)
AST: 17 IU/L (ref 0–40)
BILIRUBIN TOTAL: 0.5 mg/dL (ref 0.0–1.2)
Bilirubin, Direct: 0.12 mg/dL (ref 0.00–0.40)
TOTAL PROTEIN: 6.6 g/dL (ref 6.0–8.5)

## 2016-06-23 ENCOUNTER — Encounter: Payer: Self-pay | Admitting: Family Medicine

## 2016-06-23 ENCOUNTER — Ambulatory Visit (INDEPENDENT_AMBULATORY_CARE_PROVIDER_SITE_OTHER): Payer: BLUE CROSS/BLUE SHIELD | Admitting: Family Medicine

## 2016-06-23 VITALS — BP 110/80 | Ht 76.5 in | Wt 235.0 lb

## 2016-06-23 DIAGNOSIS — K1321 Leukoplakia of oral mucosa, including tongue: Secondary | ICD-10-CM | POA: Diagnosis not present

## 2016-06-23 DIAGNOSIS — Z23 Encounter for immunization: Secondary | ICD-10-CM | POA: Diagnosis not present

## 2016-06-23 DIAGNOSIS — Z0000 Encounter for general adult medical examination without abnormal findings: Secondary | ICD-10-CM | POA: Diagnosis not present

## 2016-06-23 MED ORDER — LISINOPRIL 10 MG PO TABS: 10.0000 mg | ORAL_TABLET | Freq: Every day | ORAL | 5 refills | Status: DC

## 2016-06-23 NOTE — Progress Notes (Signed)
   Subjective:    Patient ID: Christopher Buckley, male    DOB: Oct 23, 1970, 45 y.o.   MRN: [id number]  HPI The patient comes in today for a wellness visit.  The patient does not chew tobacco but he does smoke he tried to quit in stayed away from cigarettes for several weeks then he went back to smoking. He has noted an area around his tongue over the past few weeks that concerns him.  A review of their health history was completed.  A review of medications was also completed.  Any needed refills: none  Eating habits: good  Falls/  MVA accidents in past few months: none  Regular exercise: yes at work and home  Specialist pt sees on regular basis: none  Preventative health issues were discussed.   Additional concerns: none   Review of Systems  Constitutional: Negative for activity change, appetite change and fever.  HENT: Negative for congestion and rhinorrhea.   Eyes: Negative for discharge.  Respiratory: Negative for cough and wheezing.   Cardiovascular: Negative for chest pain.  Gastrointestinal: Negative for abdominal pain, blood in stool and vomiting.  Genitourinary: Negative for difficulty urinating and frequency.  Musculoskeletal: Negative for neck pain.  Skin: Negative for rash.  Allergic/Immunologic: Negative for environmental allergies and food allergies.  Neurological: Negative for weakness and headaches.  Psychiatric/Behavioral: Negative for agitation.       Objective:   Physical Exam  Constitutional: He appears well-developed and well-nourished.  HENT:  Head: Normocephalic and atraumatic.  Right Ear: External ear normal.  Left Ear: External ear normal.  Nose: Nose normal.  Mouth/Throat: Oropharynx is clear and moist.  Eyes: EOM are normal. Pupils are equal, round, and reactive to light.  Neck: Normal range of motion. Neck supple. No thyromegaly present.  Cardiovascular: Normal rate, regular rhythm and normal heart sounds.   No murmur  heard. Pulmonary/Chest: Effort normal and breath sounds normal. No respiratory distress. He has no wheezes.  Abdominal: Soft. Bowel sounds are normal. He exhibits no distension and no mass. There is no tenderness.  Genitourinary: Penis normal.  Musculoskeletal: Normal range of motion. He exhibits no edema.  Lymphadenopathy:    He has no cervical adenopathy.  Neurological: He is alert. He exhibits normal muscle tone.  Skin: Skin is warm and dry. No erythema.  Psychiatric: He has a normal mood and affect. His behavior is normal. Judgment normal.   On oral exam the patient has what appears to be leukoplakia underneath his tongue on the left side and there does appear to be a small ulcer in that area. When I felt it with a gloved finger it does have some thickened texture to it.       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 Patient would prefer to be seen in [place]. This needs evaluation. May well need biopsy as well. I've encouraged patient to quit smoking 100%. If he has ongoing troubles or worsening issues he should follow-up. ENT will be seeing him in the near future.  Adult wellness-complete.wellness physical was conducted today. Importance of diet and exercise were discussed in detail. In addition to this a discussion regarding safety was also covered. We also reviewed over immunizations and gave recommendations regarding current immunization needed for age. In addition to this additional areas were also touched on including: Preventative health exams needed: Colonoscopy not indicated currently  Patient was advised yearly wellness exam  HTN good control previous lab work completed.

## 2016-06-23 NOTE — Patient Instructions (Signed)
DASH Eating Plan  DASH stands for "Dietary Approaches to Stop Hypertension." The DASH eating plan is a healthy eating plan that has been shown to reduce high blood pressure (hypertension). Additional health benefits may include reducing the risk of type 2 diabetes mellitus, heart disease, and stroke. The DASH eating plan may also help with weight loss.  WHAT DO I NEED TO KNOW ABOUT THE DASH EATING PLAN?  For the DASH eating plan, you will follow these general guidelines:  · Choose foods with a percent daily value for sodium of less than 5% (as listed on the food label).  · Use salt-free seasonings or herbs instead of table salt or sea salt.  · Check with your health care provider or pharmacist before using salt substitutes.  · Eat lower-sodium products, often labeled as "lower sodium" or "no salt added."  · Eat fresh foods.  · Eat more vegetables, fruits, and low-fat dairy products.  · Choose whole grains. Look for the word "whole" as the first word in the ingredient list.  · Choose fish and skinless chicken or turkey more often than red meat. Limit fish, poultry, and meat to 6 oz (170 g) each day.  · Limit sweets, desserts, sugars, and sugary drinks.  · Choose heart-healthy fats.  · Limit cheese to 1 oz (28 g) per day.  · Eat more home-cooked food and less restaurant, buffet, and fast food.  · Limit fried foods.  · Cook foods using methods other than frying.  · Limit canned vegetables. If you do use them, rinse them well to decrease the sodium.  · When eating at a restaurant, ask that your food be prepared with less salt, or no salt if possible.  WHAT FOODS CAN I EAT?  Seek help from a dietitian for individual calorie needs.  Grains  Whole grain or whole wheat bread. Brown rice. Whole grain or whole wheat pasta. Quinoa, bulgur, and whole grain cereals. Low-sodium cereals. Corn or whole wheat flour tortillas. Whole grain cornbread. Whole grain crackers. Low-sodium crackers.  Vegetables  Fresh or frozen vegetables  (raw, steamed, roasted, or grilled). Low-sodium or reduced-sodium tomato and vegetable juices. Low-sodium or reduced-sodium tomato sauce and paste. Low-sodium or reduced-sodium canned vegetables.   Fruits  All fresh, canned (in natural juice), or frozen fruits.  Meat and Other Protein Products  Ground beef (85% or leaner), grass-fed beef, or beef trimmed of fat. Skinless chicken or turkey. Ground chicken or turkey. Pork trimmed of fat. All fish and seafood. Eggs. Dried beans, peas, or lentils. Unsalted nuts and seeds. Unsalted canned beans.  Dairy  Low-fat dairy products, such as skim or 1% milk, 2% or reduced-fat cheeses, low-fat ricotta or cottage cheese, or plain low-fat yogurt. Low-sodium or reduced-sodium cheeses.  Fats and Oils  Tub margarines without trans fats. Light or reduced-fat mayonnaise and salad dressings (reduced sodium). Avocado. Safflower, olive, or canola oils. Natural peanut or almond butter.  Other  Unsalted popcorn and pretzels.  The items listed above may not be a complete list of recommended foods or beverages. Contact your dietitian for more options.  WHAT FOODS ARE NOT RECOMMENDED?  Grains  White bread. White pasta. White rice. Refined cornbread. Bagels and croissants. Crackers that contain trans fat.  Vegetables  Creamed or fried vegetables. Vegetables in a cheese sauce. Regular canned vegetables. Regular canned tomato sauce and paste. Regular tomato and vegetable juices.  Fruits  Dried fruits. Canned fruit in light or heavy syrup. Fruit juice.  Meat and Other Protein   Products  Fatty cuts of meat. Ribs, chicken wings, bacon, sausage, bologna, salami, chitterlings, fatback, hot dogs, bratwurst, and packaged luncheon meats. Salted nuts and seeds. Canned beans with salt.  Dairy  Whole or 2% milk, cream, half-and-half, and cream cheese. Whole-fat or sweetened yogurt. Full-fat cheeses or blue cheese. Nondairy creamers and whipped toppings. Processed cheese, cheese spreads, or cheese  curds.  Condiments  Onion and garlic salt, seasoned salt, table salt, and sea salt. Canned and packaged gravies. Worcestershire sauce. Tartar sauce. Barbecue sauce. Teriyaki sauce. Soy sauce, including reduced sodium. Steak sauce. Fish sauce. Oyster sauce. Cocktail sauce. Horseradish. Ketchup and mustard. Meat flavorings and tenderizers. Bouillon cubes. Hot sauce. Tabasco sauce. Marinades. Taco seasonings. Relishes.  Fats and Oils  Butter, stick margarine, lard, shortening, ghee, and bacon fat. Coconut, palm kernel, or palm oils. Regular salad dressings.  Other  Pickles and olives. Salted popcorn and pretzels.  The items listed above may not be a complete list of foods and beverages to avoid. Contact your dietitian for more information.  WHERE CAN I FIND MORE INFORMATION?  National Heart, Lung, and [employer] Institute: www.nhlbi.nih.gov/health/health-topics/topics/dash/     This information is not intended to replace advice given to you by your health care provider. Make sure you discuss any questions you have with your health care provider.     Document Released: 08/13/2011 Document Revised: 09/14/2014 Document Reviewed: 06/28/2013  Elsevier Interactive Patient Education ©2016 [employer]

## 2016-06-24 ENCOUNTER — Encounter: Payer: Self-pay | Admitting: Family Medicine

## 2016-07-16 ENCOUNTER — Encounter: Payer: Self-pay | Admitting: Otolaryngology

## 2016-07-16 ENCOUNTER — Ambulatory Visit (INDEPENDENT_AMBULATORY_CARE_PROVIDER_SITE_OTHER): Payer: BLUE CROSS/BLUE SHIELD | Admitting: Otolaryngology

## 2016-07-16 DIAGNOSIS — D3702 Neoplasm of uncertain behavior of tongue: Secondary | ICD-10-CM | POA: Diagnosis not present

## 2016-07-28 ENCOUNTER — Other Ambulatory Visit (INDEPENDENT_AMBULATORY_CARE_PROVIDER_SITE_OTHER): Payer: Self-pay | Admitting: Otolaryngology

## 2016-07-28 DIAGNOSIS — C029 Malignant neoplasm of tongue, unspecified: Secondary | ICD-10-CM

## 2016-08-05 ENCOUNTER — Ambulatory Visit (HOSPITAL_COMMUNITY)
Admission: RE | Admit: 2016-08-05 | Discharge: 2016-08-05 | Disposition: A | Discharge status: Home / Self Care | Payer: BLUE CROSS/BLUE SHIELD | Source: Ambulatory Visit | Attending: Otolaryngology | Admitting: Otolaryngology

## 2016-08-05 DIAGNOSIS — C029 Malignant neoplasm of tongue, unspecified: Secondary | ICD-10-CM | POA: Insufficient documentation

## 2016-08-05 LAB — GLUCOSE, CAPILLARY: Glucose-Capillary: 122 mg/dL — ABNORMAL HIGH (ref 65–99)

## 2016-08-05 MED ORDER — FLUDEOXYGLUCOSE F - 18 (FDG) INJECTION
11.5400 | Freq: Once | INTRAVENOUS | Status: AC | PRN
  Administered 2016-08-05: 11.54 via INTRAVENOUS

## 2016-08-06 ENCOUNTER — Encounter (HOSPITAL_COMMUNITY): Payer: Self-pay | Admitting: Lab

## 2016-08-06 ENCOUNTER — Encounter (HOSPITAL_COMMUNITY): Payer: BLUE CROSS/BLUE SHIELD | Attending: Hematology & Oncology | Admitting: Hematology & Oncology

## 2016-08-06 ENCOUNTER — Encounter (HOSPITAL_COMMUNITY): Payer: Self-pay | Admitting: Hematology & Oncology

## 2016-08-06 VITALS — BP 153/89 | HR 64 | Temp 98.7°F | Resp 16 | Ht 78.0 in | Wt 241.8 lb

## 2016-08-06 DIAGNOSIS — C029 Malignant neoplasm of tongue, unspecified: Secondary | ICD-10-CM | POA: Diagnosis not present

## 2016-08-06 DIAGNOSIS — Z720 Tobacco use: Secondary | ICD-10-CM | POA: Diagnosis not present

## 2016-08-06 DIAGNOSIS — K1321 Leukoplakia of oral mucosa, including tongue: Secondary | ICD-10-CM | POA: Diagnosis not present

## 2016-08-06 NOTE — Progress Notes (Signed)
[facility] NOTE  Patient Care Team: Kathyrn Drown, MD as PCP - General (Family Medicine)  CHIEF COMPLAINTS/PURPOSE OF CONSULTATION:  SCca tongue Left lateral tongue leukoplakia Tobacco use  HISTORY OF PRESENTING ILLNESS:  Christopher Buckley 45 y.o. male is here for a consultation due to SCCa of the tongue. Referred by Dr. Benjamine Mola.  Patient is accompanied by his wife today.   Mr. Darko reports that his dentist noticed left lateral tongue leukoplakia during a routine exam. He was advised observation and about 2-3 months ago, the tongue area began to exhibit ulceration. Pt underwent a tongue biopsy on 11/09 with Dr. Benjamine Mola that demonstrated SCCa. A PET/CT was subsequently obtained that revealed no evidence of wide spread disease. PET results noted below: IMPRESSION: 1. Increased radiotracer uptake within base of tongue and bilateral tonsillar regions identified as above. 2. Sub cm right level 2 lymph node exhibits equivocal increased uptake. 3. No evidence for hypermetabolic distant metastatic disease.  Denies otalgia, night sweats, adenopathy, fevers, or significant pain. He notes tongue is aggravated by spicy foods. Of note, he has a 20+ ppd smoking history.   He has follow-up with Dr. Benjamine Mola Monday but was referred to [place] prior to seeing Dr. Benjamine Mola in follow-up. Patient presents today to discuss how to proceed.  He has had a flu shot this year. Denies any other significant concerns or problems.   MEDICAL HISTORY:  Past Medical History:  Diagnosis Date  . Allergy   . Hypertension   . Idiopathic thrombocytopenia ([place]) 2000    SURGICAL HISTORY: History reviewed. No pertinent surgical history.  SOCIAL HISTORY: Social History   Social History  . Marital status: Married    Spouse name: N/A  . Number of children: N/A  . Years of education: N/A   Occupational History  . Not on file.   Social History Main Topics  . Smoking status: Former Research scientist (life sciences)  .  Smokeless tobacco: Former Systems developer    Quit date: 04/01/2015     Comment: states he quit 08/05/16  . Alcohol use 0.0 oz/week     Comment: socially  . Drug use: No  . Sexual activity: Not on file     Comment: married- 1 child   Other Topics Concern  . Not on file   Social History Narrative  . No narrative on file  Married 1 child Patient is a pack a day smoker Social EtOh Christopher Buckley is watching his son play baseball   FAMILY HISTORY: Family History  Problem Relation Age of Onset  . Hypertension Father   . Hyperlipidemia Father   . Hypertension Paternal Priscille Kluver and Father are living and healthy; 23 yo and 45 yo  ALLERGIES:  has No Known Allergies.  MEDICATIONS:  Current Outpatient Prescriptions  Medication Sig Dispense Refill  . lisinopril (PRINIVIL,ZESTRIL) 10 MG tablet Take 1 tablet (10 mg total) by mouth daily. 30 tablet 5   No current facility-administered medications for this visit.     Review of Systems  Constitutional: Negative.   HENT: Negative.        Sore spot on the base of tongue  Eyes: Negative.   Respiratory: Negative.   Cardiovascular: Negative.   Gastrointestinal: Negative.   Genitourinary: Negative.   Musculoskeletal: Negative.   Skin: Negative.   Neurological: Negative.   Endo/Heme/Allergies: Negative.   Psychiatric/Behavioral: Negative.   All other systems reviewed and are negative.  14 point ROS was done and is otherwise as detailed  above or in HPI  PHYSICAL EXAMINATION: ECOG PERFORMANCE STATUS: 0 - Asymptomatic  Vitals:   08/06/16 1533  BP: (!) 153/89  Pulse: 64  Resp: 16  Temp: 98.7 F (37.1 C)   Filed Weights   08/06/16 1533  Weight: 241 lb 12.8 oz (109.7 kg)     Physical Exam  Constitutional: He is oriented to person, place, and time and well-developed, well-nourished, and in no distress.  HENT:  Head: Normocephalic and atraumatic.  Mouth/Throat: Oropharynx is clear and moist.  Leukoplakia on left tongue    Ulcerated area where he was biopsied.   Eyes: Conjunctivae and EOM are normal. Pupils are equal, round, and reactive to light.  Neck: Normal range of motion. Neck supple.  Cardiovascular: Normal rate, regular rhythm and normal heart sounds.   Pulmonary/Chest: Effort normal and breath sounds normal.  Abdominal: Soft. Bowel sounds are normal.  Musculoskeletal: Normal range of motion.  Neurological: He is alert and oriented to person, place, and time. Gait normal.  Skin: Skin is warm and dry.  Nursing note and vitals reviewed.   Dictation #1 [id number]  [id number]  LABORATORY DATA:  I have reviewed the data as listed No results found for: WBC, HGB, HCT, MCV, PLT CMP     Component Value Date/Time   NA 142 06/20/2016 0944   K 4.5 06/20/2016 0944   CL 103 06/20/2016 0944   CO2 24 06/20/2016 0944   GLUCOSE 115 (H) 06/20/2016 0944   GLUCOSE 106 (H) 07/03/2014 0704   BUN 11 06/20/2016 0944   CREATININE 0.94 06/20/2016 0944   CREATININE 1.02 07/03/2014 0704   CALCIUM 9.5 06/20/2016 0944   PROT 6.6 06/20/2016 0944   ALBUMIN 4.5 06/20/2016 0944   AST 17 06/20/2016 0944   ALT 34 06/20/2016 0944   ALKPHOS 74 06/20/2016 0944   BILITOT 0.5 06/20/2016 0944   GFRNONAA 98 06/20/2016 0944   GFRAA 113 06/20/2016 0944     RADIOGRAPHIC STUDIES: I have personally reviewed the radiological images as listed and agreed with the findings in the report. Nm Pet Image Initial (pi) Skull Base To Thigh  Result Date: 08/05/2016 CLINICAL DATA:  Initial treatment strategy for head and neck carcinoma. EXAM: NUCLEAR MEDICINE PET SKULL BASE TO THIGH TECHNIQUE: 7.0 mCi F-18 FDG was injected intravenously. Full-ring PET imaging was performed from the skull base to thigh after the radiotracer. CT data was obtained and used for attenuation correction and anatomic localization. FASTING BLOOD GLUCOSE:  Value: 122 mg/dl COMPARISON:  None. FINDINGS: NECK Within the central portion of the base of tongue there  is focal increased radiotracer uptake within SUV max equal to 5.57. Within SUV max equal to 8.39 on the right and 7.7 on the left. There is a right level-II lymph node which measures 7 mm and has an SUV max equal to 3.29. No pathologically enlarged cervical lymph nodes identified. CHEST No hypermetabolic mediastinal or hilar nodes. No suspicious pulmonary nodules on the CT scan. Scar versus subsegmental atelectasis noted in the right base. ABDOMEN/PELVIS No abnormal hypermetabolic activity within the liver, pancreas, adrenal glands, or spleen. No hypermetabolic lymph nodes in the abdomen or pelvis. SKELETON No focal hypermetabolic activity to suggest skeletal metastasis. IMPRESSION: 1. Increased radiotracer uptake within base of tongue and bilateral tonsillar regions identified as above. 2. Sub cm right level 2 lymph node exhibits equivocal increased uptake. 3. No evidence for hypermetabolic distant metastatic disease. Electronically Signed   By: Kerby Moors M.D.   On: 08/05/2016 10:55  ASSESSMENT & PLAN:  SCca tongue Left lateral tongue leukoplakia Tobacco use  I went over the results of his PET scan with him. Patient has an equivocal lymph node on PET, PE is unremarkable without palpable adenopathy. PET/CT results are noted above.  We reviewed the NCCN guidelines in regards to treatment of SCCa of the tongue. We discussed leukoplakia and ongoing follow-up and management of leukoplakia. We discussed smoking cessation in detail.  I explained to the patient that in regards to his tongue cancer surgical options would most likely be preferred, I have recommended referral to [facility].  I have discussed all of this with Dr. Benjamine Mola. I called Dr. Benjamine Mola to inform him that Christopher Buckley and his wife were at [facility] today and that I discussed with Christopher Buckley the results of his PET scan. I asked Dr. Benjamine Mola if he would still like to see them on Monday or if I should go ahead and make the referral to [facility]. He agreed  that I make the referral today.   I will follow up with patient after the New Year. All questions were answered. They were advised to call with questions or concerns.   This document serves as a record of services personally performed by Ancil Linsey, MD. It was created on her behalf by Elmyra Ricks, a trained medical scribe. The creation of this record is based on the scribe's personal observations and the provider's statements to them. This document has been checked and approved by the attending provider.  I have reviewed the above documentation for accuracy and completeness and I agree with the above.  This note was electronically signed.  Molli Hazard, MD  08/06/2016 4:25 PM

## 2016-08-06 NOTE — Progress Notes (Unsigned)
Referral sent to [facility] Sharyon [facility]. appt 12/4.  Patient ware.  Records faxed on 11/30

## 2016-08-06 NOTE — Patient Instructions (Addendum)
[place] at [facility] Discharge Instructions  RECOMMENDATIONS MADE BY THE CONSULTANT AND ANY TEST RESULTS WILL BE SENT TO YOUR REFERRING PHYSICIAN.  You saw Dr.Penland today. Dr. Whitney Muse is going to cancel your appointment Monday with Dr. Benjamine Mola ( she has already talked with him). You will be referred to Dr. Vicie Mutters or Dr. Nicolette Bang [phone]). Follow up here at cancer center after new year. See Amy at checkout for appointments.  Thank you for choosing [place] at [facility] to provide your oncology and hematology care.  To afford each patient quality time with our provider, please arrive at least 15 minutes before your scheduled appointment time.   Beginning January 23rd 2017 lab work for the [facility] will be done in the  Main lab at [facility] on 1st floor. If you have a lab appointment with the [facility] please come in thru the  Main Entrance and check in at the main information desk  You need to re-schedule your appointment should you arrive 10 or more minutes late.  We strive to give you quality time with our providers, and arriving late affects you and other patients whose appointments are after yours.  Also, if you no show three or more times for appointments you may be dismissed from the clinic at the providers discretion.     Again, thank you for choosing [facility].  Our hope is that these requests will decrease the amount of time that you wait before being seen by our physicians.       _____________________________________________________________  Should you have questions after your visit to [facility], please contact our office at [phone] between the hours of 8:30 a.m. and 4:30 p.m.  Voicemails left after 4:30 p.m. will not be returned until the following business day.  For prescription refill requests, have your pharmacy contact our office.         Resources For Cancer Patients  and their Caregivers ? American Cancer Society: Can assist with transportation, wigs, general needs, runs Look Good Feel Better.        714-160-9472 ? Cancer Care: Provides financial assistance, online support groups, medication/co-pay assistance.  1-800-813-HOPE (310)370-4001) ? [place] Assists [place] cancer patients and their families through emotional , educational and financial support.  (617)212-2587 ? [place] DSS Where to apply for food stamps, Medicaid and utility assistance. 4150819470 ? [facility]: Transportation to medical appointments. 781-698-5960 ? Social Security Administration: May apply for disability if have a Stage IV cancer. 913 489 7814 (585)191-1697 ? [employer], Disability and Transit Services: Assists with nutrition, care and transit needs. [place] Support Programs: @10RELATIVEDAYS @ > Cancer Support Group  2nd Tuesday of the month 1pm-2pm, Journey Room  > Creative Journey  3rd Tuesday of the month 1130am-1pm, Journey Room  > Look Good Feel Better  1st Wednesday of the month 10am-12 noon, Journey Room (Call [facility] to register [phone])

## 2016-08-29 ENCOUNTER — Encounter (HOSPITAL_COMMUNITY): Payer: Self-pay | Admitting: Hematology & Oncology

## 2016-09-14 ENCOUNTER — Ambulatory Visit (HOSPITAL_COMMUNITY): Payer: BLUE CROSS/BLUE SHIELD | Admitting: Hematology & Oncology

## 2016-09-15 NOTE — Progress Notes (Signed)
This encounter was created in error - please disregard.

## 2016-10-01 ENCOUNTER — Telehealth: Payer: Self-pay | Admitting: Family Medicine

## 2016-10-01 NOTE — Telephone Encounter (Signed)
ERROR

## 2017-01-18 ENCOUNTER — Ambulatory Visit: Payer: BLUE CROSS/BLUE SHIELD | Admitting: Family Medicine

## 2017-01-19 ENCOUNTER — Encounter: Payer: Self-pay | Admitting: Family Medicine

## 2017-01-19 ENCOUNTER — Ambulatory Visit (INDEPENDENT_AMBULATORY_CARE_PROVIDER_SITE_OTHER): Payer: Commercial Managed Care - PPO | Admitting: Family Medicine

## 2017-01-19 VITALS — BP 160/108 | Ht 76.5 in | Wt 250.5 lb

## 2017-01-19 DIAGNOSIS — I10 Essential (primary) hypertension: Secondary | ICD-10-CM | POA: Diagnosis not present

## 2017-01-19 MED ORDER — LISINOPRIL-HYDROCHLOROTHIAZIDE 10-12.5 MG PO TABS: 1.0000 | ORAL_TABLET | Freq: Every day | ORAL | 5 refills | Status: DC

## 2017-01-19 NOTE — Progress Notes (Signed)
   Subjective:    Patient ID: Christopher Buckley, male    DOB: 1970/12/04, 46 y.o.   MRN: [id number]  Hypertension  This is a recurrent problem. The current episode started more than 1 month ago. Pertinent negatives include no chest pain, headaches or shortness of breath.  The patient has noted a noted on several different times blood pressure been elevated denies any chest tightness pressure pain shortness of breath he has quit smoking he has a history of oral cancer  Patient states no other concerns this visit.  Review of Systems  Constitutional: Negative for activity change, fatigue and fever.  Respiratory: Negative for cough and shortness of breath.   Cardiovascular: Negative for chest pain and leg swelling.  Neurological: Negative for headaches.       Objective:   Physical Exam  Constitutional: He appears well-nourished. No distress.  Cardiovascular: Normal rate, regular rhythm and normal heart sounds.   No murmur heard. Pulmonary/Chest: Effort normal and breath sounds normal. No respiratory distress.  Musculoskeletal: He exhibits no edema.  Lymphadenopathy:    He has no cervical adenopathy.  Neurological: He is alert.  Psychiatric: His behavior is normal.  Vitals reviewed.   Patient is to get a bicep blood pressure cuff to use      Assessment & Plan:  Exceptional stressful job patient will work from a mental health standpoint to try to approach his stress better does not want antidepressant patient denies being depressed  HTN change medication lisinopril with 12.5 of HCTZ recheck blood pressure 6 weeks lab work later this year

## 2017-01-19 NOTE — Patient Instructions (Signed)
DASH Eating Plan DASH stands for "Dietary Approaches to Stop Hypertension." The DASH eating plan is a healthy eating plan that has been shown to reduce high blood pressure (hypertension). It may also reduce your risk for type 2 diabetes, heart disease, and stroke. The DASH eating plan may also help with weight loss. What are tips for following this plan? General guidelines  Avoid eating more than 2,300 mg (milligrams) of salt (sodium) a day. If you have hypertension, you may need to reduce your sodium intake to 1,500 mg a day.  Limit alcohol intake to no more than 1 drink a day for nonpregnant women and 2 drinks a day for men. One drink equals 12 oz of beer, 5 oz of wine, or 1 oz of hard liquor.  Work with your health care provider to maintain a healthy body weight or to lose weight. Ask what an ideal weight is for you.  Get at least 30 minutes of exercise that causes your heart to beat faster (aerobic exercise) most days of the week. Activities may include walking, swimming, or biking.  Work with your health care provider or diet and nutrition specialist (dietitian) to adjust your eating plan to your individual calorie needs. Reading food labels  Check food labels for the amount of sodium per serving. Choose foods with less than 5 percent of the Daily Value of sodium. Generally, foods with less than 300 mg of sodium per serving fit into this eating plan.  To find whole grains, look for the word "whole" as the first word in the ingredient list. Shopping  Buy products labeled as "low-sodium" or "no salt added."  Buy fresh foods. Avoid canned foods and premade or frozen meals. Cooking  Avoid adding salt when cooking. Use salt-free seasonings or herbs instead of table salt or sea salt. Check with your health care provider or pharmacist before using salt substitutes.  Do not fry foods. Cook foods using healthy methods such as baking, boiling, grilling, and broiling instead.  Cook with  heart-healthy oils, such as olive, canola, soybean, or sunflower oil. Meal planning   Eat a balanced diet that includes: ? 5 or more servings of fruits and vegetables each day. At each meal, try to fill half of your plate with fruits and vegetables. ? Up to 6-8 servings of whole grains each day. ? Less than 6 oz of lean meat, poultry, or fish each day. A 3-oz serving of meat is about the same size as a deck of cards. One egg equals 1 oz. ? 2 servings of low-fat dairy each day. ? A serving of nuts, seeds, or beans 5 times each week. ? Heart-healthy fats. Healthy fats called Omega-3 fatty acids are found in foods such as flaxseeds and coldwater fish, like sardines, salmon, and mackerel.  Limit how much you eat of the following: ? Canned or prepackaged foods. ? Food that is high in trans fat, such as fried foods. ? Food that is high in saturated fat, such as fatty meat. ? Sweets, desserts, sugary drinks, and other foods with added sugar. ? Full-fat dairy products.  Do not salt foods before eating.  Try to eat at least 2 vegetarian meals each week.  Eat more home-cooked food and less restaurant, buffet, and fast food.  When eating at a restaurant, ask that your food be prepared with less salt or no salt, if possible. What foods are recommended? The items listed may not be a complete list. Talk with your dietitian about what   dietary choices are best for you. Grains Whole-grain or whole-wheat bread. Whole-grain or whole-wheat pasta. Brown rice. Oatmeal. Quinoa. Bulgur. Whole-grain and low-sodium cereals. Pita bread. Low-fat, low-sodium crackers. Whole-wheat flour tortillas. Vegetables Fresh or frozen vegetables (raw, steamed, roasted, or grilled). Low-sodium or reduced-sodium tomato and vegetable juice. Low-sodium or reduced-sodium tomato sauce and tomato paste. Low-sodium or reduced-sodium canned vegetables. Fruits All fresh, dried, or frozen fruit. Canned fruit in natural juice (without  added sugar). Meat and other protein foods Skinless chicken or turkey. Ground chicken or turkey. Pork with fat trimmed off. Fish and seafood. Egg whites. Dried beans, peas, or lentils. Unsalted nuts, nut butters, and seeds. Unsalted canned beans. Lean cuts of beef with fat trimmed off. Low-sodium, lean deli meat. Dairy Low-fat (1%) or fat-free (skim) milk. Fat-free, low-fat, or reduced-fat cheeses. Nonfat, low-sodium ricotta or cottage cheese. Low-fat or nonfat yogurt. Low-fat, low-sodium cheese. Fats and oils Soft margarine without trans fats. Vegetable oil. Low-fat, reduced-fat, or light mayonnaise and salad dressings (reduced-sodium). Canola, safflower, olive, soybean, and sunflower oils. Avocado. Seasoning and other foods Herbs. Spices. Seasoning mixes without salt. Unsalted popcorn and pretzels. Fat-free sweets. What foods are not recommended? The items listed may not be a complete list. Talk with your dietitian about what dietary choices are best for you. Grains Baked goods made with fat, such as croissants, muffins, or some breads. Dry pasta or rice meal packs. Vegetables Creamed or fried vegetables. Vegetables in a cheese sauce. Regular canned vegetables (not low-sodium or reduced-sodium). Regular canned tomato sauce and paste (not low-sodium or reduced-sodium). Regular tomato and vegetable juice (not low-sodium or reduced-sodium). Pickles. Olives. Fruits Canned fruit in a light or heavy syrup. Fried fruit. Fruit in cream or butter sauce. Meat and other protein foods Fatty cuts of meat. Ribs. Fried meat. Bacon. Sausage. Bologna and other processed lunch meats. Salami. Fatback. Hotdogs. Bratwurst. Salted nuts and seeds. Canned beans with added salt. Canned or smoked fish. Whole eggs or egg yolks. Chicken or turkey with skin. Dairy Whole or 2% milk, cream, and half-and-half. Whole or full-fat cream cheese. Whole-fat or sweetened yogurt. Full-fat cheese. Nondairy creamers. Whipped toppings.  Processed cheese and cheese spreads. Fats and oils Butter. Stick margarine. Lard. Shortening. Ghee. Bacon fat. Tropical oils, such as coconut, palm kernel, or palm oil. Seasoning and other foods Salted popcorn and pretzels. Onion salt, garlic salt, seasoned salt, table salt, and sea salt. Worcestershire sauce. Tartar sauce. Barbecue sauce. Teriyaki sauce. Soy sauce, including reduced-sodium. Steak sauce. Canned and packaged gravies. Fish sauce. Oyster sauce. Cocktail sauce. Horseradish that you find on the shelf. Ketchup. Mustard. Meat flavorings and tenderizers. Bouillon cubes. Hot sauce and Tabasco sauce. Premade or packaged marinades. Premade or packaged taco seasonings. Relishes. Regular salad dressings. Where to find more information:  National Heart, Lung, and Blood Institute: www.nhlbi.nih.gov  American Heart Association: www.heart.org Summary  The DASH eating plan is a healthy eating plan that has been shown to reduce high blood pressure (hypertension). It may also reduce your risk for type 2 diabetes, heart disease, and stroke.  With the DASH eating plan, you should limit salt (sodium) intake to 2,300 mg a day. If you have hypertension, you may need to reduce your sodium intake to 1,500 mg a day.  When on the DASH eating plan, aim to eat more fresh fruits and vegetables, whole grains, lean proteins, low-fat dairy, and heart-healthy fats.  Work with your health care provider or diet and nutrition specialist (dietitian) to adjust your eating plan to your individual   calorie needs. This information is not intended to replace advice given to you by your health care provider. Make sure you discuss any questions you have with your health care provider. Document Released: 08/13/2011 Document Revised: 08/17/2016 Document Reviewed: 08/17/2016 Elsevier Interactive Patient Education  2017 Elsevier Inc.  

## 2017-03-02 ENCOUNTER — Encounter: Payer: Self-pay | Admitting: Family Medicine

## 2017-03-02 ENCOUNTER — Ambulatory Visit (INDEPENDENT_AMBULATORY_CARE_PROVIDER_SITE_OTHER): Payer: Commercial Managed Care - PPO | Admitting: Family Medicine

## 2017-03-02 VITALS — BP 132/90 | Ht 76.0 in | Wt 252.0 lb

## 2017-03-02 DIAGNOSIS — I10 Essential (primary) hypertension: Secondary | ICD-10-CM

## 2017-03-02 MED ORDER — LISINOPRIL-HYDROCHLOROTHIAZIDE 20-12.5 MG PO TABS: 1.0000 | ORAL_TABLET | Freq: Every day | ORAL | 5 refills | Status: DC

## 2017-03-02 NOTE — Progress Notes (Signed)
   Subjective:    Patient ID: Christopher Buckley, male    DOB: Apr 22, 1971, 46 y.o.   MRN: [id number]  Hypertension  Pertinent negatives include no chest pain, headaches or shortness of breath. Treatments tried: lisinopril/hctz.  Patient is here today follow-up of blood pressure taken her medicine as directed watching his diet does not smoke tries exercise some under a moderate amount of stress denies high fever chills sweats denies chest pain Pt states concerns.    Review of Systems  Constitutional: Negative for activity change, fatigue and fever.  Respiratory: Negative for cough and shortness of breath.   Cardiovascular: Negative for chest pain and leg swelling.  Neurological: Negative for headaches.       Objective:   Physical Exam  Constitutional: He appears well-nourished. No distress.  Cardiovascular: Normal rate, regular rhythm and normal heart sounds.   No murmur heard. Pulmonary/Chest: Effort normal and breath sounds normal. No respiratory distress.  Musculoskeletal: He exhibits no edema.  Lymphadenopathy:    He has no cervical adenopathy.  Neurological: He is alert.  Psychiatric: His behavior is normal.  Vitals reviewed.         Assessment & Plan:  HTN Low-salt diet regular physical activity Adjust medication lisinopril 20 mg/HCTZ 12.5 mg If any problems from the medication or to low blood pressure stop medicine notifies [place] addition to this follow-up within the next couple weeks for a courtesy blood pressure checked

## 2017-03-02 NOTE — Patient Instructions (Signed)
DASH Eating Plan DASH stands for "Dietary Approaches to Stop Hypertension." The DASH eating plan is a healthy eating plan that has been shown to reduce high blood pressure (hypertension). It may also reduce your risk for type 2 diabetes, heart disease, and stroke. The DASH eating plan may also help with weight loss. What are tips for following this plan? General guidelines  Avoid eating more than 2,300 mg (milligrams) of salt (sodium) a day. If you have hypertension, you may need to reduce your sodium intake to 1,500 mg a day.  Limit alcohol intake to no more than 1 drink a day for nonpregnant women and 2 drinks a day for men. One drink equals 12 oz of beer, 5 oz of wine, or 1 oz of hard liquor.  Work with your health care provider to maintain a healthy body weight or to lose weight. Ask what an ideal weight is for you.  Get at least 30 minutes of exercise that causes your heart to beat faster (aerobic exercise) most days of the week. Activities may include walking, swimming, or biking.  Work with your health care provider or diet and nutrition specialist (dietitian) to adjust your eating plan to your individual calorie needs. Reading food labels  Check food labels for the amount of sodium per serving. Choose foods with less than 5 percent of the Daily Value of sodium. Generally, foods with less than 300 mg of sodium per serving fit into this eating plan.  To find whole grains, look for the word "whole" as the first word in the ingredient list. Shopping  Buy products labeled as "low-sodium" or "no salt added."  Buy fresh foods. Avoid canned foods and premade or frozen meals. Cooking  Avoid adding salt when cooking. Use salt-free seasonings or herbs instead of table salt or sea salt. Check with your health care provider or pharmacist before using salt substitutes.  Do not fry foods. Cook foods using healthy methods such as baking, boiling, grilling, and broiling instead.  Cook with  heart-healthy oils, such as olive, canola, soybean, or sunflower oil. Meal planning   Eat a balanced diet that includes: ? 5 or more servings of fruits and vegetables each day. At each meal, try to fill half of your plate with fruits and vegetables. ? Up to 6-8 servings of whole grains each day. ? Less than 6 oz of lean meat, poultry, or fish each day. A 3-oz serving of meat is about the same size as a deck of cards. One egg equals 1 oz. ? 2 servings of low-fat dairy each day. ? A serving of nuts, seeds, or beans 5 times each week. ? Heart-healthy fats. Healthy fats called Omega-3 fatty acids are found in foods such as flaxseeds and coldwater fish, like sardines, salmon, and mackerel.  Limit how much you eat of the following: ? Canned or prepackaged foods. ? Food that is high in trans fat, such as fried foods. ? Food that is high in saturated fat, such as fatty meat. ? Sweets, desserts, sugary drinks, and other foods with added sugar. ? Full-fat dairy products.  Do not salt foods before eating.  Try to eat at least 2 vegetarian meals each week.  Eat more home-cooked food and less restaurant, buffet, and fast food.  When eating at a restaurant, ask that your food be prepared with less salt or no salt, if possible. What foods are recommended? The items listed may not be a complete list. Talk with your dietitian about what   dietary choices are best for you. Grains Whole-grain or whole-wheat bread. Whole-grain or whole-wheat pasta. Brown rice. Oatmeal. Quinoa. Bulgur. Whole-grain and low-sodium cereals. Pita bread. Low-fat, low-sodium crackers. Whole-wheat flour tortillas. Vegetables Fresh or frozen vegetables (raw, steamed, roasted, or grilled). Low-sodium or reduced-sodium tomato and vegetable juice. Low-sodium or reduced-sodium tomato sauce and tomato paste. Low-sodium or reduced-sodium canned vegetables. Fruits All fresh, dried, or frozen fruit. Canned fruit in natural juice (without  added sugar). Meat and other protein foods Skinless chicken or turkey. Ground chicken or turkey. Pork with fat trimmed off. Fish and seafood. Egg whites. Dried beans, peas, or lentils. Unsalted nuts, nut butters, and seeds. Unsalted canned beans. Lean cuts of beef with fat trimmed off. Low-sodium, lean deli meat. Dairy Low-fat (1%) or fat-free (skim) milk. Fat-free, low-fat, or reduced-fat cheeses. Nonfat, low-sodium ricotta or cottage cheese. Low-fat or nonfat yogurt. Low-fat, low-sodium cheese. Fats and oils Soft margarine without trans fats. Vegetable oil. Low-fat, reduced-fat, or light mayonnaise and salad dressings (reduced-sodium). Canola, safflower, olive, soybean, and sunflower oils. Avocado. Seasoning and other foods Herbs. Spices. Seasoning mixes without salt. Unsalted popcorn and pretzels. Fat-free sweets. What foods are not recommended? The items listed may not be a complete list. Talk with your dietitian about what dietary choices are best for you. Grains Baked goods made with fat, such as croissants, muffins, or some breads. Dry pasta or rice meal packs. Vegetables Creamed or fried vegetables. Vegetables in a cheese sauce. Regular canned vegetables (not low-sodium or reduced-sodium). Regular canned tomato sauce and paste (not low-sodium or reduced-sodium). Regular tomato and vegetable juice (not low-sodium or reduced-sodium). Pickles. Olives. Fruits Canned fruit in a light or heavy syrup. Fried fruit. Fruit in cream or butter sauce. Meat and other protein foods Fatty cuts of meat. Ribs. Fried meat. Bacon. Sausage. Bologna and other processed lunch meats. Salami. Fatback. Hotdogs. Bratwurst. Salted nuts and seeds. Canned beans with added salt. Canned or smoked fish. Whole eggs or egg yolks. Chicken or turkey with skin. Dairy Whole or 2% milk, cream, and half-and-half. Whole or full-fat cream cheese. Whole-fat or sweetened yogurt. Full-fat cheese. Nondairy creamers. Whipped toppings.  Processed cheese and cheese spreads. Fats and oils Butter. Stick margarine. Lard. Shortening. Ghee. Bacon fat. Tropical oils, such as coconut, palm kernel, or palm oil. Seasoning and other foods Salted popcorn and pretzels. Onion salt, garlic salt, seasoned salt, table salt, and sea salt. Worcestershire sauce. Tartar sauce. Barbecue sauce. Teriyaki sauce. Soy sauce, including reduced-sodium. Steak sauce. Canned and packaged gravies. Fish sauce. Oyster sauce. Cocktail sauce. Horseradish that you find on the shelf. Ketchup. Mustard. Meat flavorings and tenderizers. Bouillon cubes. Hot sauce and Tabasco sauce. Premade or packaged marinades. Premade or packaged taco seasonings. Relishes. Regular salad dressings. Where to find more information:  National Heart, Lung, and Blood Institute: www.nhlbi.nih.gov  American Heart Association: www.heart.org Summary  The DASH eating plan is a healthy eating plan that has been shown to reduce high blood pressure (hypertension). It may also reduce your risk for type 2 diabetes, heart disease, and stroke.  With the DASH eating plan, you should limit salt (sodium) intake to 2,300 mg a day. If you have hypertension, you may need to reduce your sodium intake to 1,500 mg a day.  When on the DASH eating plan, aim to eat more fresh fruits and vegetables, whole grains, lean proteins, low-fat dairy, and heart-healthy fats.  Work with your health care provider or diet and nutrition specialist (dietitian) to adjust your eating plan to your individual   calorie needs. This information is not intended to replace advice given to you by your health care provider. Make sure you discuss any questions you have with your health care provider. Document Released: 08/13/2011 Document Revised: 08/17/2016 Document Reviewed: 08/17/2016 Elsevier Interactive Patient Education  2017 Elsevier Inc.  

## 2017-04-08 ENCOUNTER — Telehealth: Payer: Self-pay

## 2017-04-08 ENCOUNTER — Other Ambulatory Visit: Payer: Self-pay

## 2017-04-08 NOTE — Telephone Encounter (Signed)
Patient showed up today at the office for his free bp check per your last office visit note. Patients medication was increased at the last ov with Dr. Nicki Reaper to Lisinopril/HCTZ 20/12.5.BP today after letting pt rest for five min was 144/102.Pt has no complaints or concerns states he feels better after the change in the Lisinopril. Spoke with Hoyle Sauer [facility] she states no changes to medication at present,but monitor Bp at home write them down and bring in to visit with Dr. Nicki Reaper on 05/26/2017. Ask the pt to decrease his stress level , needs to reduce wt as he has gain a significant amount since quitting smoking last November.

## 2017-04-11 NOTE — Telephone Encounter (Signed)
Please let the patient know that I would like to change his medication to a slightly stronger dose. Lisinopril/HCTZ new dose 20/25 mg. One each morning. 30 days with 3 refills a appointment in September bring BP readings with him

## 2017-04-12 NOTE — Telephone Encounter (Signed)
Pt states he only had that one high reading and he ran in from work and it was one of those days. He states he has been checking bp at home and it has been good. He wants to hold off on changing the med for now. He will bring in bp readings in September.

## 2017-04-12 NOTE — Telephone Encounter (Signed)
Sounds fine we can stick with current dosing we will see him in September

## 2017-05-26 ENCOUNTER — Ambulatory Visit (INDEPENDENT_AMBULATORY_CARE_PROVIDER_SITE_OTHER): Payer: Commercial Managed Care - PPO | Admitting: Family Medicine

## 2017-05-26 ENCOUNTER — Encounter: Payer: Self-pay | Admitting: Family Medicine

## 2017-05-26 VITALS — BP 138/94 | Ht 76.0 in | Wt 250.0 lb

## 2017-05-26 DIAGNOSIS — Z0000 Encounter for general adult medical examination without abnormal findings: Secondary | ICD-10-CM

## 2017-05-26 MED ORDER — LISINOPRIL-HYDROCHLOROTHIAZIDE 20-25 MG PO TABS: 1.0000 | ORAL_TABLET | Freq: Every day | ORAL | 5 refills | Status: DC

## 2017-05-26 MED ORDER — SILDENAFIL CITRATE 20 MG PO TABS: ORAL_TABLET | ORAL | 3 refills | Status: DC

## 2017-05-26 NOTE — Progress Notes (Signed)
   Subjective:    Patient ID: Christopher Buckley, male    DOB: [birth year], 46 y.o.   MRN: [id number]  HPI The patient comes in today for a wellness visit.    A review of their health history was completed.  A review of medications was also completed.  Any needed refills; None  Eating habits: Good  Falls/  MVA accidents in past few months: None  Regular exercise: Not as much as he should  Specialist pt sees on regular basis: Dr. Owens Shark @ [facility]   Preventative health issues were discussed.  Patient counseled at length regarding healthy habits minimizing alcohol use avoiding smoking plus also safety with driving also counseled regarding blood pressure medication exercise and watching diet Additional concerns: None  Review of Systems  Constitutional: Negative for activity change, appetite change and fever.  HENT: Negative for congestion and rhinorrhea.   Eyes: Negative for discharge.  Respiratory: Negative for cough and wheezing.   Cardiovascular: Negative for chest pain.  Gastrointestinal: Negative for abdominal pain, blood in stool and vomiting.  Genitourinary: Negative for difficulty urinating and frequency.  Musculoskeletal: Negative for neck pain.  Skin: Negative for rash.  Allergic/Immunologic: Negative for environmental allergies and food allergies.  Neurological: Negative for weakness and headaches.  Psychiatric/Behavioral: Negative for agitation.       Objective:   Physical Exam  Constitutional: He appears well-developed and well-nourished.  HENT:  Head: Normocephalic and atraumatic.  Right Ear: External ear normal.  Left Ear: External ear normal.  Nose: Nose normal.  Mouth/Throat: Oropharynx is clear and moist.  Eyes: Pupils are equal, round, and reactive to light. EOM are normal.  Neck: Normal range of motion. Neck supple. No thyromegaly present.  Cardiovascular: Normal rate, regular rhythm and normal heart sounds.   No murmur heard. Pulmonary/Chest: Effort  normal and breath sounds normal. No respiratory distress. He has no wheezes.  Abdominal: Soft. Bowel sounds are normal. He exhibits no distension and no mass. There is no tenderness.  Genitourinary: Penis normal.  Musculoskeletal: Normal range of motion. He exhibits no edema.  Lymphadenopathy:    He has no cervical adenopathy.  Neurological: He is alert. He exhibits normal muscle tone.  Skin: Skin is warm and dry. No erythema.  Psychiatric: He has a normal mood and affect. His behavior is normal. Judgment normal.  No sign of any oral lesions has a history of oral cancer   Blood pressure is borderline he will try to do better with diet he will try to do better with cardiovascular exercise he has quit smoking.     Assessment & Plan:  Adult wellness-complete.wellness physical was conducted today. Importance of diet and exercise were discussed in detail. In addition to this a discussion regarding safety was also covered. We also reviewed over immunizations and gave recommendations regarding current immunization needed for age. In addition to this additional areas were also touched on including: Preventative health exams needed: Colonoscopy Not indicated currently Prostate exam not indicated  Patient was advised yearly wellness exam  Blood pressure medicine was adjusted he will follow-up 3-4 weeks to recheck blood pressure

## 2017-06-23 ENCOUNTER — Encounter: Payer: Self-pay | Admitting: Family Medicine

## 2017-06-23 ENCOUNTER — Ambulatory Visit (INDEPENDENT_AMBULATORY_CARE_PROVIDER_SITE_OTHER): Payer: Commercial Managed Care - PPO | Admitting: Family Medicine

## 2017-06-23 VITALS — BP 124/94 | Ht 76.0 in | Wt 250.1 lb

## 2017-06-23 DIAGNOSIS — I10 Essential (primary) hypertension: Secondary | ICD-10-CM

## 2017-06-23 DIAGNOSIS — Z23 Encounter for immunization: Secondary | ICD-10-CM

## 2017-06-23 MED ORDER — AMLODIPINE BESYLATE 2.5 MG PO TABS: 2.5000 mg | ORAL_TABLET | Freq: Every day | ORAL | 3 refills | Status: DC

## 2017-06-23 NOTE — Patient Instructions (Signed)
DASH Eating Plan DASH stands for "Dietary Approaches to Stop Hypertension." The DASH eating plan is a healthy eating plan that has been shown to reduce high blood pressure (hypertension). It may also reduce your risk for type 2 diabetes, heart disease, and stroke. The DASH eating plan may also help with weight loss. What are tips for following this plan? General guidelines  Avoid eating more than 2,300 mg (milligrams) of salt (sodium) a day. If you have hypertension, you may need to reduce your sodium intake to 1,500 mg a day.  Limit alcohol intake to no more than 1 drink a day for nonpregnant women and 2 drinks a day for men. One drink equals 12 oz of beer, 5 oz of wine, or 1 oz of hard liquor.  Work with your health care provider to maintain a healthy body weight or to lose weight. Ask what an ideal weight is for you.  Get at least 30 minutes of exercise that causes your heart to beat faster (aerobic exercise) most days of the week. Activities may include walking, swimming, or biking.  Work with your health care provider or diet and nutrition specialist (dietitian) to adjust your eating plan to your individual calorie needs. Reading food labels  Check food labels for the amount of sodium per serving. Choose foods with less than 5 percent of the Daily Value of sodium. Generally, foods with less than 300 mg of sodium per serving fit into this eating plan.  To find whole grains, look for the word "whole" as the first word in the ingredient list. Shopping  Buy products labeled as "low-sodium" or "no salt added."  Buy fresh foods. Avoid canned foods and premade or frozen meals. Cooking  Avoid adding salt when cooking. Use salt-free seasonings or herbs instead of table salt or sea salt. Check with your health care provider or pharmacist before using salt substitutes.  Do not fry foods. Cook foods using healthy methods such as baking, boiling, grilling, and broiling instead.  Cook with  heart-healthy oils, such as olive, canola, soybean, or sunflower oil. Meal planning   Eat a balanced diet that includes: ? 5 or more servings of fruits and vegetables each day. At each meal, try to fill half of your plate with fruits and vegetables. ? Up to 6-8 servings of whole grains each day. ? Less than 6 oz of lean meat, poultry, or fish each day. A 3-oz serving of meat is about the same size as a deck of cards. One egg equals 1 oz. ? 2 servings of low-fat dairy each day. ? A serving of nuts, seeds, or beans 5 times each week. ? Heart-healthy fats. Healthy fats called Omega-3 fatty acids are found in foods such as flaxseeds and coldwater fish, like sardines, salmon, and mackerel.  Limit how much you eat of the following: ? Canned or prepackaged foods. ? Food that is high in trans fat, such as fried foods. ? Food that is high in saturated fat, such as fatty meat. ? Sweets, desserts, sugary drinks, and other foods with added sugar. ? Full-fat dairy products.  Do not salt foods before eating.  Try to eat at least 2 vegetarian meals each week.  Eat more home-cooked food and less restaurant, buffet, and fast food.  When eating at a restaurant, ask that your food be prepared with less salt or no salt, if possible. What foods are recommended? The items listed may not be a complete list. Talk with your dietitian about what   dietary choices are best for you. Grains Whole-grain or whole-wheat bread. Whole-grain or whole-wheat pasta. Brown rice. Oatmeal. Quinoa. Bulgur. Whole-grain and low-sodium cereals. Pita bread. Low-fat, low-sodium crackers. Whole-wheat flour tortillas. Vegetables Fresh or frozen vegetables (raw, steamed, roasted, or grilled). Low-sodium or reduced-sodium tomato and vegetable juice. Low-sodium or reduced-sodium tomato sauce and tomato paste. Low-sodium or reduced-sodium canned vegetables. Fruits All fresh, dried, or frozen fruit. Canned fruit in natural juice (without  added sugar). Meat and other protein foods Skinless chicken or turkey. Ground chicken or turkey. Pork with fat trimmed off. Fish and seafood. Egg whites. Dried beans, peas, or lentils. Unsalted nuts, nut butters, and seeds. Unsalted canned beans. Lean cuts of beef with fat trimmed off. Low-sodium, lean deli meat. Dairy Low-fat (1%) or fat-free (skim) milk. Fat-free, low-fat, or reduced-fat cheeses. Nonfat, low-sodium ricotta or cottage cheese. Low-fat or nonfat yogurt. Low-fat, low-sodium cheese. Fats and oils Soft margarine without trans fats. Vegetable oil. Low-fat, reduced-fat, or light mayonnaise and salad dressings (reduced-sodium). Canola, safflower, olive, soybean, and sunflower oils. Avocado. Seasoning and other foods Herbs. Spices. Seasoning mixes without salt. Unsalted popcorn and pretzels. Fat-free sweets. What foods are not recommended? The items listed may not be a complete list. Talk with your dietitian about what dietary choices are best for you. Grains Baked goods made with fat, such as croissants, muffins, or some breads. Dry pasta or rice meal packs. Vegetables Creamed or fried vegetables. Vegetables in a cheese sauce. Regular canned vegetables (not low-sodium or reduced-sodium). Regular canned tomato sauce and paste (not low-sodium or reduced-sodium). Regular tomato and vegetable juice (not low-sodium or reduced-sodium). Pickles. Olives. Fruits Canned fruit in a light or heavy syrup. Fried fruit. Fruit in cream or butter sauce. Meat and other protein foods Fatty cuts of meat. Ribs. Fried meat. Bacon. Sausage. Bologna and other processed lunch meats. Salami. Fatback. Hotdogs. Bratwurst. Salted nuts and seeds. Canned beans with added salt. Canned or smoked fish. Whole eggs or egg yolks. Chicken or turkey with skin. Dairy Whole or 2% milk, cream, and half-and-half. Whole or full-fat cream cheese. Whole-fat or sweetened yogurt. Full-fat cheese. Nondairy creamers. Whipped toppings.  Processed cheese and cheese spreads. Fats and oils Butter. Stick margarine. Lard. Shortening. Ghee. Bacon fat. Tropical oils, such as coconut, palm kernel, or palm oil. Seasoning and other foods Salted popcorn and pretzels. Onion salt, garlic salt, seasoned salt, table salt, and sea salt. Worcestershire sauce. Tartar sauce. Barbecue sauce. Teriyaki sauce. Soy sauce, including reduced-sodium. Steak sauce. Canned and packaged gravies. Fish sauce. Oyster sauce. Cocktail sauce. Horseradish that you find on the shelf. Ketchup. Mustard. Meat flavorings and tenderizers. Bouillon cubes. Hot sauce and Tabasco sauce. Premade or packaged marinades. Premade or packaged taco seasonings. Relishes. Regular salad dressings. Where to find more information:  National Heart, Lung, and Blood Institute: www.nhlbi.nih.gov  American Heart Association: www.heart.org Summary  The DASH eating plan is a healthy eating plan that has been shown to reduce high blood pressure (hypertension). It may also reduce your risk for type 2 diabetes, heart disease, and stroke.  With the DASH eating plan, you should limit salt (sodium) intake to 2,300 mg a day. If you have hypertension, you may need to reduce your sodium intake to 1,500 mg a day.  When on the DASH eating plan, aim to eat more fresh fruits and vegetables, whole grains, lean proteins, low-fat dairy, and heart-healthy fats.  Work with your health care provider or diet and nutrition specialist (dietitian) to adjust your eating plan to your individual   calorie needs. This information is not intended to replace advice given to you by your health care provider. Make sure you discuss any questions you have with your health care provider. Document Released: 08/13/2011 Document Revised: 08/17/2016 Document Reviewed: 08/17/2016 Elsevier Interactive Patient Education  2017 Elsevier Inc.  

## 2017-06-23 NOTE — Progress Notes (Signed)
   Subjective:    Patient ID: Christopher Buckley, male    DOB: October 23, 1970, 46 y.o.   MRN: [id number]  Hypertension  Pertinent negatives include no chest pain, headaches or shortness of breath.  Patient is back today for a follow up on HTN from ov 05/26/2017, where at that time his blood pressure medication was adjusted. He is currently taking Lisinopril -HCTZ 20/25 one daily. He says he is now watching his diet. He does not exercise, but work hard at work and at home.  He is under a lot of stress he does not smoke he does his stress is contributing to his blood pressure issues he does try to eat relatively healthy until recently when he had  Average  Review of Systems  Constitutional: Negative for activity change, fatigue and fever.  Respiratory: Negative for cough and shortness of breath.   Cardiovascular: Negative for chest pain and leg swelling.  Neurological: Negative for headaches.       Objective:   Physical Exam  Constitutional: He appears well-nourished. No distress.  Cardiovascular: Normal rate, regular rhythm and normal heart sounds.   No murmur heard. Pulmonary/Chest: Effort normal and breath sounds normal. No respiratory distress.  Musculoskeletal: He exhibits no edema.  Lymphadenopathy:    He has no cervical adenopathy.  Neurological: He is alert.  Psychiatric: His behavior is normal.  Vitals reviewed.         Assessment & Plan:  Elevated blood pressure Add amlodipine 2.5 Follow-up 4-6 weeks Goal is to get blood pressure 130/80 or less Healthy diet regular physical activity recommended

## 2017-07-21 ENCOUNTER — Encounter: Payer: Self-pay | Admitting: Family Medicine

## 2017-07-21 ENCOUNTER — Ambulatory Visit (INDEPENDENT_AMBULATORY_CARE_PROVIDER_SITE_OTHER): Payer: Commercial Managed Care - PPO | Admitting: Family Medicine

## 2017-07-21 VITALS — BP 138/98 | Ht 76.0 in | Wt 251.0 lb

## 2017-07-21 DIAGNOSIS — I10 Essential (primary) hypertension: Secondary | ICD-10-CM

## 2017-07-21 MED ORDER — AMLODIPINE BESYLATE 5 MG PO TABS: 5.0000 mg | ORAL_TABLET | Freq: Every day | ORAL | 5 refills | Status: DC

## 2017-07-21 NOTE — Patient Instructions (Signed)
DASH Eating Plan DASH stands for "Dietary Approaches to Stop Hypertension." The DASH eating plan is a healthy eating plan that has been shown to reduce high blood pressure (hypertension). It may also reduce your risk for type 2 diabetes, heart disease, and stroke. The DASH eating plan may also help with weight loss. What are tips for following this plan? General guidelines  Avoid eating more than 2,300 mg (milligrams) of salt (sodium) a day. If you have hypertension, you may need to reduce your sodium intake to 1,500 mg a day.  Limit alcohol intake to no more than 1 drink a day for nonpregnant women and 2 drinks a day for men. One drink equals 12 oz of beer, 5 oz of wine, or 1 oz of hard liquor.  Work with your health care provider to maintain a healthy body weight or to lose weight. Ask what an ideal weight is for you.  Get at least 30 minutes of exercise that causes your heart to beat faster (aerobic exercise) most days of the week. Activities may include walking, swimming, or biking.  Work with your health care provider or diet and nutrition specialist (dietitian) to adjust your eating plan to your individual calorie needs. Reading food labels  Check food labels for the amount of sodium per serving. Choose foods with less than 5 percent of the Daily Value of sodium. Generally, foods with less than 300 mg of sodium per serving fit into this eating plan.  To find whole grains, look for the word "whole" as the first word in the ingredient list. Shopping  Buy products labeled as "low-sodium" or "no salt added."  Buy fresh foods. Avoid canned foods and premade or frozen meals. Cooking  Avoid adding salt when cooking. Use salt-free seasonings or herbs instead of table salt or sea salt. Check with your health care provider or pharmacist before using salt substitutes.  Do not fry foods. Cook foods using healthy methods such as baking, boiling, grilling, and broiling instead.  Cook with  heart-healthy oils, such as olive, canola, soybean, or sunflower oil. Meal planning   Eat a balanced diet that includes: ? 5 or more servings of fruits and vegetables each day. At each meal, try to fill half of your plate with fruits and vegetables. ? Up to 6-8 servings of whole grains each day. ? Less than 6 oz of lean meat, poultry, or fish each day. A 3-oz serving of meat is about the same size as a deck of cards. One egg equals 1 oz. ? 2 servings of low-fat dairy each day. ? A serving of nuts, seeds, or beans 5 times each week. ? Heart-healthy fats. Healthy fats called Omega-3 fatty acids are found in foods such as flaxseeds and coldwater fish, like sardines, salmon, and mackerel.  Limit how much you eat of the following: ? Canned or prepackaged foods. ? Food that is high in trans fat, such as fried foods. ? Food that is high in saturated fat, such as fatty meat. ? Sweets, desserts, sugary drinks, and other foods with added sugar. ? Full-fat dairy products.  Do not salt foods before eating.  Try to eat at least 2 vegetarian meals each week.  Eat more home-cooked food and less restaurant, buffet, and fast food.  When eating at a restaurant, ask that your food be prepared with less salt or no salt, if possible. What foods are recommended? The items listed may not be a complete list. Talk with your dietitian about what   dietary choices are best for you. Grains Whole-grain or whole-wheat bread. Whole-grain or whole-wheat pasta. Brown rice. Oatmeal. Quinoa. Bulgur. Whole-grain and low-sodium cereals. Pita bread. Low-fat, low-sodium crackers. Whole-wheat flour tortillas. Vegetables Fresh or frozen vegetables (raw, steamed, roasted, or grilled). Low-sodium or reduced-sodium tomato and vegetable juice. Low-sodium or reduced-sodium tomato sauce and tomato paste. Low-sodium or reduced-sodium canned vegetables. Fruits All fresh, dried, or frozen fruit. Canned fruit in natural juice (without  added sugar). Meat and other protein foods Skinless chicken or turkey. Ground chicken or turkey. Pork with fat trimmed off. Fish and seafood. Egg whites. Dried beans, peas, or lentils. Unsalted nuts, nut butters, and seeds. Unsalted canned beans. Lean cuts of beef with fat trimmed off. Low-sodium, lean deli meat. Dairy Low-fat (1%) or fat-free (skim) milk. Fat-free, low-fat, or reduced-fat cheeses. Nonfat, low-sodium ricotta or cottage cheese. Low-fat or nonfat yogurt. Low-fat, low-sodium cheese. Fats and oils Soft margarine without trans fats. Vegetable oil. Low-fat, reduced-fat, or light mayonnaise and salad dressings (reduced-sodium). Canola, safflower, olive, soybean, and sunflower oils. Avocado. Seasoning and other foods Herbs. Spices. Seasoning mixes without salt. Unsalted popcorn and pretzels. Fat-free sweets. What foods are not recommended? The items listed may not be a complete list. Talk with your dietitian about what dietary choices are best for you. Grains Baked goods made with fat, such as croissants, muffins, or some breads. Dry pasta or rice meal packs. Vegetables Creamed or fried vegetables. Vegetables in a cheese sauce. Regular canned vegetables (not low-sodium or reduced-sodium). Regular canned tomato sauce and paste (not low-sodium or reduced-sodium). Regular tomato and vegetable juice (not low-sodium or reduced-sodium). Pickles. Olives. Fruits Canned fruit in a light or heavy syrup. Fried fruit. Fruit in cream or butter sauce. Meat and other protein foods Fatty cuts of meat. Ribs. Fried meat. Bacon. Sausage. Bologna and other processed lunch meats. Salami. Fatback. Hotdogs. Bratwurst. Salted nuts and seeds. Canned beans with added salt. Canned or smoked fish. Whole eggs or egg yolks. Chicken or turkey with skin. Dairy Whole or 2% milk, cream, and half-and-half. Whole or full-fat cream cheese. Whole-fat or sweetened yogurt. Full-fat cheese. Nondairy creamers. Whipped toppings.  Processed cheese and cheese spreads. Fats and oils Butter. Stick margarine. Lard. Shortening. Ghee. Bacon fat. Tropical oils, such as coconut, palm kernel, or palm oil. Seasoning and other foods Salted popcorn and pretzels. Onion salt, garlic salt, seasoned salt, table salt, and sea salt. Worcestershire sauce. Tartar sauce. Barbecue sauce. Teriyaki sauce. Soy sauce, including reduced-sodium. Steak sauce. Canned and packaged gravies. Fish sauce. Oyster sauce. Cocktail sauce. Horseradish that you find on the shelf. Ketchup. Mustard. Meat flavorings and tenderizers. Bouillon cubes. Hot sauce and Tabasco sauce. Premade or packaged marinades. Premade or packaged taco seasonings. Relishes. Regular salad dressings. Where to find more information:  National Heart, Lung, and Blood Institute: www.nhlbi.nih.gov  American Heart Association: www.heart.org Summary  The DASH eating plan is a healthy eating plan that has been shown to reduce high blood pressure (hypertension). It may also reduce your risk for type 2 diabetes, heart disease, and stroke.  With the DASH eating plan, you should limit salt (sodium) intake to 2,300 mg a day. If you have hypertension, you may need to reduce your sodium intake to 1,500 mg a day.  When on the DASH eating plan, aim to eat more fresh fruits and vegetables, whole grains, lean proteins, low-fat dairy, and heart-healthy fats.  Work with your health care provider or diet and nutrition specialist (dietitian) to adjust your eating plan to your individual   calorie needs. This information is not intended to replace advice given to you by your health care provider. Make sure you discuss any questions you have with your health care provider. Document Released: 08/13/2011 Document Revised: 08/17/2016 Document Reviewed: 08/17/2016 Elsevier Interactive Patient Education  2017 Elsevier Inc.  

## 2017-07-21 NOTE — Progress Notes (Signed)
   Subjective:    Patient ID: Christopher Buckley, male    DOB: Oct 23, 1970, 46 y.o.   MRN: [id number]  Hypertension  This is a recurrent problem. The current episode started more than 1 year ago. Pertinent negatives include no chest pain, headaches or shortness of breath.  He is on Norvasc 2.5 mg one daily, on Lisinopril 20-25 mg one daily. Says he has been checking bps at home and some are better, he says weekend they are better than during the week. Eating healthy,and exercising when he can. Patient is not smoking He is exercising syrup Under a lot of stress at work Blood pressure tends to be higher at work PMH hypertension   Review of Systems  Constitutional: Negative for activity change, fatigue and fever.  Respiratory: Negative for cough and shortness of breath.   Cardiovascular: Negative for chest pain and leg swelling.  Neurological: Negative for headaches.       Objective:   Physical Exam  Constitutional: He appears well-nourished. No distress.  Cardiovascular: Normal rate, regular rhythm and normal heart sounds.  No murmur heard. Pulmonary/Chest: Effort normal and breath sounds normal. No respiratory distress.  Musculoskeletal: He exhibits no edema.  Lymphadenopathy:    He has no cervical adenopathy.  Neurological: He is alert.  Psychiatric: His behavior is normal.  Vitals reviewed.         Assessment & Plan:  HTN subpar control best reading 128/90 I would like to see it more in the 120s over 70s I recommend that the patient increase amlodipine he will now use 5 mg daily.  He will not let us know if there is any side effects He will also continue his lisinopril Healthy eating recommended regular physical activity recommended follow-up in the spring time send [place] blood pressure readings somewhere in the next few weeks

## 2017-09-24 MED ORDER — ACETAMINOPHEN 500 MG PO TABS
1000.00 | ORAL_TABLET | ORAL | Status: DC
Start: 2017-09-24 — End: 2017-09-24

## 2017-09-24 MED ORDER — OXYCODONE HCL 5 MG PO TABS
5.00 | ORAL_TABLET | ORAL | Status: DC
Start: ? — End: 2017-09-24

## 2017-09-24 MED ORDER — ONDANSETRON 4 MG PO TBDP
4.00 | ORAL_TABLET | ORAL | Status: DC
Start: ? — End: 2017-09-24

## 2017-09-24 MED ORDER — SODIUM CHLORIDE 0.9 % IJ SOLN
10.00 | INTRAMUSCULAR | Status: DC
Start: ? — End: 2017-09-24

## 2017-09-24 MED ORDER — ENOXAPARIN SODIUM 40 MG/0.4ML ~~LOC~~ SOLN
40.00 | SUBCUTANEOUS | Status: DC
Start: 2017-09-25 — End: 2017-09-24

## 2017-09-24 MED ORDER — KCL IN DEXTROSE-NACL 20-5-0.45 MEQ/L-%-% IV SOLN
INTRAVENOUS | Status: DC
Start: ? — End: 2017-09-24

## 2017-09-24 MED ORDER — SODIUM CHLORIDE 0.9 % IJ SOLN
10.00 | INTRAMUSCULAR | Status: DC
Start: 2017-09-24 — End: 2017-09-24

## 2017-09-24 MED ORDER — GENERIC EXTERNAL MEDICATION
Status: DC
Start: 2017-09-25 — End: 2017-09-24

## 2017-09-24 MED ORDER — AMLODIPINE BESYLATE 5 MG PO TABS
5.00 | ORAL_TABLET | ORAL | Status: DC
Start: 2017-09-25 — End: 2017-09-24

## 2017-10-16 ENCOUNTER — Other Ambulatory Visit: Payer: Self-pay | Admitting: Family Medicine

## 2017-11-17 ENCOUNTER — Other Ambulatory Visit: Payer: Self-pay | Admitting: Family Medicine

## 2018-01-16 ENCOUNTER — Other Ambulatory Visit: Payer: Self-pay | Admitting: Family Medicine

## 2018-01-18 ENCOUNTER — Encounter: Payer: Self-pay | Admitting: Family Medicine

## 2018-01-18 ENCOUNTER — Ambulatory Visit (INDEPENDENT_AMBULATORY_CARE_PROVIDER_SITE_OTHER): Payer: Commercial Managed Care - PPO | Admitting: Family Medicine

## 2018-01-18 VITALS — BP 138/78 | Temp 97.6°F | Ht 76.0 in | Wt 229.2 lb

## 2018-01-18 DIAGNOSIS — I10 Essential (primary) hypertension: Secondary | ICD-10-CM | POA: Diagnosis not present

## 2018-01-18 DIAGNOSIS — J029 Acute pharyngitis, unspecified: Secondary | ICD-10-CM

## 2018-01-18 DIAGNOSIS — E782 Mixed hyperlipidemia: Secondary | ICD-10-CM

## 2018-01-18 LAB — POCT RAPID STREP A (OFFICE): Rapid Strep A Screen: NEGATIVE

## 2018-01-18 MED ORDER — NYSTATIN 100000 UNIT/ML MT SUSP: OROMUCOSAL | 0 refills | Status: DC

## 2018-01-18 MED ORDER — FLUCONAZOLE 200 MG PO TABS: 200.0000 mg | ORAL_TABLET | Freq: Every day | ORAL | 0 refills | Status: DC

## 2018-01-18 NOTE — Progress Notes (Signed)
   Subjective:    Patient ID: Christopher Buckley, male    DOB: Jan 15, 1971, 47 y.o.   MRN: [id number]  Sore Throat   This is a new problem. The current episode started in the past 7 days. Associated symptoms include swollen glands. Pertinent negatives include no congestion, coughing, ear pain or vomiting. Associated symptoms comments: Hoarseness . He has tried acetaminophen (Advil) for the symptoms.   Results for orders placed or performed in visit on 01/18/18  POCT rapid strep A  Result Value Ref Range   Rapid Strep A Screen Negative Negative   Started few days ago with sore throat had a little bit of drainage also some hoarseness feels thickened in his throat also white areas on his throat as well recent radiation treatment stopped about 6 weeks ago due to head and neck cancer  Review of Systems  Constitutional: Negative for activity change, chills and fever.  HENT: Positive for rhinorrhea, sore throat and voice change. Negative for congestion and ear pain.   Eyes: Negative for discharge.  Respiratory: Negative for cough and wheezing.   Cardiovascular: Negative for chest pain.  Gastrointestinal: Negative for nausea and vomiting.  Musculoskeletal: Negative for arthralgias.       Objective:   Physical Exam  Constitutional: He appears well-developed.  HENT:  Head: Normocephalic.  Mouth/Throat: Oropharynx is clear and moist. No oropharyngeal exudate.  Neck: Normal range of motion.  Cardiovascular: Normal rate, regular rhythm and normal heart sounds.  No murmur heard. Pulmonary/Chest: Effort normal and breath sounds normal. He has no wheezes.  Lymphadenopathy:    He has no cervical adenopathy.  Neurological: He exhibits normal muscle tone.  Skin: Skin is warm and dry.  Nursing note and vitals reviewed.   No masses felt around the neck patient does have throat erythema along with some slight whitish exudate which is more consistent with a possibility of yeast infection There is  some erythema in the throat with some lacy white exudate     Assessment & Plan:  Possible yeast pharyngitis Diflucan as directed oral nystatin 1 teaspoon swish and swallow 4 times daily for the next 7 days If progressive troubles or worse follow-up I do not feel antibiotics are indicated Patient had recent metabolic 7 done through his cancer center Does need cholesterol profile. Blood pressure good control patient requests  refills

## 2018-01-19 ENCOUNTER — Ambulatory Visit: Payer: Commercial Managed Care - PPO | Admitting: Family Medicine

## 2018-01-19 LAB — STREP A DNA PROBE: STREP GP A DIRECT, DNA PROBE: NEGATIVE

## 2018-01-19 LAB — SPECIMEN STATUS REPORT

## 2018-02-13 ENCOUNTER — Other Ambulatory Visit: Payer: Self-pay | Admitting: Family Medicine

## 2018-04-27 ENCOUNTER — Telehealth: Payer: Self-pay | Admitting: Family Medicine

## 2018-04-27 DIAGNOSIS — Z114 Encounter for screening for human immunodeficiency virus [HIV]: Secondary | ICD-10-CM

## 2018-04-27 DIAGNOSIS — I10 Essential (primary) hypertension: Secondary | ICD-10-CM

## 2018-04-27 DIAGNOSIS — Z0000 Encounter for general adult medical examination without abnormal findings: Secondary | ICD-10-CM

## 2018-04-27 DIAGNOSIS — E785 Hyperlipidemia, unspecified: Secondary | ICD-10-CM

## 2018-04-27 NOTE — Telephone Encounter (Signed)
Last labs: Lipid- 01/2018                  Met 7- 11/18

## 2018-04-27 NOTE — Telephone Encounter (Signed)
Orders put in

## 2018-04-27 NOTE — Telephone Encounter (Signed)
Lipid, liver, metabolic 7, CBC, HIV antibody Physical, hypertension, hyperlipidemia, anemia, HIV screening Please inform the patient of his lab work also let him know HIV screening is recommended test for all adults one time

## 2018-04-27 NOTE — Telephone Encounter (Signed)
Discussed with pt. Pt verbalized understanding. States he did not call for bloodwork orders and not sure if he can get done before physical but he will do it as soon as he can.

## 2018-04-27 NOTE — Telephone Encounter (Signed)
Patient has physical 9/11 and needing labs done.

## 2018-05-14 LAB — BASIC METABOLIC PANEL
BUN / CREAT RATIO: 11 (ref 9–20)
BUN: 11 mg/dL (ref 6–24)
CO2: 26 mmol/L (ref 20–29)
CREATININE: 1.01 mg/dL (ref 0.76–1.27)
Calcium: 9.4 mg/dL (ref 8.7–10.2)
Chloride: 97 mmol/L (ref 96–106)
GFR calc Af Amer: 103 mL/min/{1.73_m2} (ref >59)
GFR, EST NON AFRICAN AMERICAN: 89 mL/min/{1.73_m2} (ref >59)
GLUCOSE: 104 mg/dL — AB (ref 65–99)
Potassium: 4.3 mmol/L (ref 3.5–5.2)
SODIUM: 136 mmol/L (ref 134–144)

## 2018-05-18 ENCOUNTER — Encounter: Payer: Self-pay | Admitting: Family Medicine

## 2018-05-18 ENCOUNTER — Ambulatory Visit (INDEPENDENT_AMBULATORY_CARE_PROVIDER_SITE_OTHER): Payer: Commercial Managed Care - PPO | Admitting: Family Medicine

## 2018-05-18 VITALS — BP 124/90 | Ht 76.25 in | Wt 226.0 lb

## 2018-05-18 DIAGNOSIS — I10 Essential (primary) hypertension: Secondary | ICD-10-CM | POA: Diagnosis not present

## 2018-05-18 DIAGNOSIS — Z0000 Encounter for general adult medical examination without abnormal findings: Secondary | ICD-10-CM | POA: Diagnosis not present

## 2018-05-18 DIAGNOSIS — Z23 Encounter for immunization: Secondary | ICD-10-CM | POA: Diagnosis not present

## 2018-05-18 MED ORDER — AMLODIPINE BESYLATE 5 MG PO TABS: 5.0000 mg | ORAL_TABLET | Freq: Every day | ORAL | 5 refills | Status: DC

## 2018-05-18 MED ORDER — LISINOPRIL-HYDROCHLOROTHIAZIDE 20-25 MG PO TABS: 1.0000 | ORAL_TABLET | Freq: Every day | ORAL | 5 refills | Status: DC

## 2018-05-18 MED ORDER — SILDENAFIL CITRATE 20 MG PO TABS: ORAL_TABLET | ORAL | 9 refills | Status: DC

## 2018-05-18 NOTE — Progress Notes (Signed)
   Subjective:    Patient ID: Christopher Buckley, male    DOB: 09-28-1970, 47 y.o.   MRN: [id number]  HPI The patient comes in today for a wellness visit.    A review of their health history was completed.  A review of medications was also completed.  Any needed refills; bp med  Eating habits:   Falls/  MVA accidents in past few months: none  Regular exercise: yes  Specialist pt sees on regular basis: Dr. Vicie Mutters - otolaryngologist, Dr. Melven Sartorius - oncologist, Dr. Adella Nissen - radiologist  Preventative health issues were discussed.   Additional concerns: none    Review of Systems  Constitutional: Negative for activity change, appetite change and fever.  HENT: Negative for congestion and rhinorrhea.   Eyes: Negative for discharge.  Respiratory: Negative for cough and wheezing.   Cardiovascular: Negative for chest pain.  Gastrointestinal: Negative for abdominal pain, blood in stool and vomiting.  Genitourinary: Negative for difficulty urinating and frequency.  Musculoskeletal: Negative for neck pain.  Skin: Negative for rash.  Allergic/Immunologic: Negative for environmental allergies and food allergies.  Neurological: Negative for weakness and headaches.  Psychiatric/Behavioral: Negative for agitation.       Objective:   Physical Exam  Constitutional: He appears well-developed and well-nourished.  HENT:  Head: Normocephalic and atraumatic.  Right Ear: External ear normal.  Left Ear: External ear normal.  Nose: Nose normal.  Mouth/Throat: Oropharynx is clear and moist.  Eyes: Pupils are equal, round, and reactive to light. EOM are normal.  Neck: Normal range of motion. Neck supple. No thyromegaly present.  Cardiovascular: Normal rate, regular rhythm and normal heart sounds.  No murmur heard. Pulmonary/Chest: Effort normal and breath sounds normal. No respiratory distress. He has no wheezes.  Abdominal: Soft. Bowel sounds are normal. He exhibits no distension and no  mass. There is no tenderness.  Genitourinary: Penis normal.  Musculoskeletal: Normal range of motion. He exhibits no edema.  Lymphadenopathy:    He has no cervical adenopathy.  Neurological: He is alert. He exhibits normal muscle tone.  Skin: Skin is warm and dry. No erythema.  Psychiatric: He has a normal mood and affect. His behavior is normal. Judgment normal.          Assessment & Plan:  Very nice patient undergoing treatments for neck cancer Followed by specialist at [facility]   Adult wellness-complete.wellness physical was conducted today. Importance of diet and exercise were discussed in detail.  In addition to this a discussion regarding safety was also covered. We also reviewed over immunizations and gave recommendations regarding current immunization needed for age.  In addition to this additional areas were also touched on including: Preventative health exams needed:  Colonoscopy not indicated currently  Patient was advised yearly wellness exam Flu shot today Blood pressure good control continue current measures follow-up within 6 months

## 2018-07-19 ENCOUNTER — Ambulatory Visit: Payer: Commercial Managed Care - PPO | Admitting: Family Medicine

## 2018-09-21 ENCOUNTER — Encounter: Payer: Self-pay | Admitting: Family Medicine

## 2018-09-21 ENCOUNTER — Ambulatory Visit (INDEPENDENT_AMBULATORY_CARE_PROVIDER_SITE_OTHER): Payer: Commercial Managed Care - PPO | Admitting: Family Medicine

## 2018-09-21 VITALS — BP 134/90 | Ht 76.25 in | Wt 239.2 lb

## 2018-09-21 DIAGNOSIS — I10 Essential (primary) hypertension: Secondary | ICD-10-CM | POA: Diagnosis not present

## 2018-09-21 MED ORDER — DOXAZOSIN MESYLATE 2 MG PO TABS: ORAL_TABLET | ORAL | 5 refills | Status: DC

## 2018-09-21 MED ORDER — SILDENAFIL CITRATE 20 MG PO TABS: ORAL_TABLET | ORAL | 9 refills | Status: DC

## 2018-09-21 MED ORDER — AMLODIPINE BESYLATE 5 MG PO TABS: 5.0000 mg | ORAL_TABLET | Freq: Every day | ORAL | 5 refills | Status: DC

## 2018-09-21 MED ORDER — LISINOPRIL-HYDROCHLOROTHIAZIDE 20-25 MG PO TABS: 1.0000 | ORAL_TABLET | Freq: Every day | ORAL | 5 refills | Status: DC

## 2018-09-21 NOTE — Progress Notes (Signed)
   Subjective:    Patient ID: Christopher Buckley, male    DOB: 24-Jul-1971, 48 y.o.   MRN: [id number]  Hypertension  This is a chronic problem. The current episode started more than 1 year ago. Pertinent negatives include no chest pain, headaches or shortness of breath. Risk factors for coronary artery disease include male gender. Treatments tried: norvasc, lisinopril/hctz. There are no compliance problems.    No problems  This patient has been treated for squamous cell cancer of the tongue is had previous resection it is been very difficult for him dealing with this feeling very stressed out over things in general but he denies being depressed does not feel he needs any medicine he gets a CAT scan in February and hopefully will get a clean bill of health  Review of Systems  Constitutional: Negative for activity change, fatigue and fever.  HENT: Negative for congestion and rhinorrhea.   Respiratory: Negative for cough and shortness of breath.   Cardiovascular: Negative for chest pain and leg swelling.  Gastrointestinal: Negative for abdominal pain, diarrhea and nausea.  Genitourinary: Negative for dysuria and hematuria.  Neurological: Negative for weakness and headaches.  Psychiatric/Behavioral: Negative for agitation and behavioral problems.       Objective:   Physical Exam Vitals signs reviewed.  Constitutional:      General: He is not in acute distress. HENT:     Head: Normocephalic and atraumatic.  Eyes:     General:        Right eye: No discharge.        Left eye: No discharge.  Neck:     Trachea: No tracheal deviation.  Cardiovascular:     Rate and Rhythm: Normal rate and regular rhythm.     Heart sounds: Normal heart sounds. No murmur.  Pulmonary:     Effort: Pulmonary effort is normal. No respiratory distress.     Breath sounds: Normal breath sounds.  Lymphadenopathy:     Cervical: No cervical adenopathy.  Skin:    General: Skin is warm and dry.  Neurological:   Mental Status: He is alert.     Coordination: Coordination normal.  Psychiatric:        Behavior: Behavior normal.           Assessment & Plan:  Blood pressure subpar control watch diet minimize salt keep physically active  Add doxazosin 2 mg nightly if any dizziness with this let us know check blood pressure periodically send [place] some readings  Follow-up within the next 6 months for a wellness checkup and follow-up on blood pressure  Will also keep [place] updated on what his oncologist finds

## 2018-09-21 NOTE — Patient Instructions (Signed)
DASH Eating Plan  DASH stands for "Dietary Approaches to Stop Hypertension." The DASH eating plan is a healthy eating plan that has been shown to reduce high blood pressure (hypertension). It may also reduce your risk for type 2 diabetes, heart disease, and stroke. The DASH eating plan may also help with weight loss.  What are tips for following this plan?    General guidelines   Avoid eating more than 2,300 mg (milligrams) of salt (sodium) a day. If you have hypertension, you may need to reduce your sodium intake to 1,500 mg a day.   Limit alcohol intake to no more than 1 drink a day for nonpregnant women and 2 drinks a day for men. One drink equals 12 oz of beer, 5 oz of wine, or 1 oz of hard liquor.   Work with your health care provider to maintain a healthy body weight or to lose weight. Ask what an ideal weight is for you.   Get at least 30 minutes of exercise that causes your heart to beat faster (aerobic exercise) most days of the week. Activities may include walking, swimming, or biking.   Work with your health care provider or diet and nutrition specialist (dietitian) to adjust your eating plan to your individual calorie needs.  Reading food labels     Check food labels for the amount of sodium per serving. Choose foods with less than 5 percent of the Daily Value of sodium. Generally, foods with less than 300 mg of sodium per serving fit into this eating plan.   To find whole grains, look for the word "whole" as the first word in the ingredient list.  Shopping   Buy products labeled as "low-sodium" or "no salt added."   Buy fresh foods. Avoid canned foods and premade or frozen meals.  Cooking   Avoid adding salt when cooking. Use salt-free seasonings or herbs instead of table salt or sea salt. Check with your health care provider or pharmacist before using salt substitutes.   Do not fry foods. Cook foods using healthy methods such as baking, boiling, grilling, and broiling instead.   Cook with  heart-healthy oils, such as olive, canola, soybean, or sunflower oil.  Meal planning   Eat a balanced diet that includes:  ? 5 or more servings of fruits and vegetables each day. At each meal, try to fill half of your plate with fruits and vegetables.  ? Up to 6-8 servings of whole grains each day.  ? Less than 6 oz of lean meat, poultry, or fish each day. A 3-oz serving of meat is about the same size as a deck of cards. One egg equals 1 oz.  ? 2 servings of low-fat dairy each day.  ? A serving of nuts, seeds, or beans 5 times each week.  ? Heart-healthy fats. Healthy fats called Omega-3 fatty acids are found in foods such as flaxseeds and coldwater fish, like sardines, salmon, and mackerel.   Limit how much you eat of the following:  ? Canned or prepackaged foods.  ? Food that is high in trans fat, such as fried foods.  ? Food that is high in saturated fat, such as fatty meat.  ? Sweets, desserts, sugary drinks, and other foods with added sugar.  ? Full-fat dairy products.   Do not salt foods before eating.   Try to eat at least 2 vegetarian meals each week.   Eat more home-cooked food and less restaurant, buffet, and fast food.     When eating at a restaurant, ask that your food be prepared with less salt or no salt, if possible.  What foods are recommended?  The items listed may not be a complete list. Talk with your dietitian about what dietary choices are best for you.  Grains  Whole-grain or whole-wheat bread. Whole-grain or whole-wheat pasta. Brown rice. Oatmeal. Quinoa. Bulgur. Whole-grain and low-sodium cereals. Pita bread. Low-fat, low-sodium crackers. Whole-wheat flour tortillas.  Vegetables  Fresh or frozen vegetables (raw, steamed, roasted, or grilled). Low-sodium or reduced-sodium tomato and vegetable juice. Low-sodium or reduced-sodium tomato sauce and tomato paste. Low-sodium or reduced-sodium canned vegetables.  Fruits  All fresh, dried, or frozen fruit. Canned fruit in natural juice (without  added sugar).  Meat and other protein foods  Skinless chicken or turkey. Ground chicken or turkey. Pork with fat trimmed off. Fish and seafood. Egg whites. Dried beans, peas, or lentils. Unsalted nuts, nut butters, and seeds. Unsalted canned beans. Lean cuts of beef with fat trimmed off. Low-sodium, lean deli meat.  Dairy  Low-fat (1%) or fat-free (skim) milk. Fat-free, low-fat, or reduced-fat cheeses. Nonfat, low-sodium ricotta or cottage cheese. Low-fat or nonfat yogurt. Low-fat, low-sodium cheese.  Fats and oils  Soft margarine without trans fats. Vegetable oil. Low-fat, reduced-fat, or light mayonnaise and salad dressings (reduced-sodium). Canola, safflower, olive, soybean, and sunflower oils. Avocado.  Seasoning and other foods  Herbs. Spices. Seasoning mixes without salt. Unsalted popcorn and pretzels. Fat-free sweets.  What foods are not recommended?  The items listed may not be a complete list. Talk with your dietitian about what dietary choices are best for you.  Grains  Baked goods made with fat, such as croissants, muffins, or some breads. Dry pasta or rice meal packs.  Vegetables  Creamed or fried vegetables. Vegetables in a cheese sauce. Regular canned vegetables (not low-sodium or reduced-sodium). Regular canned tomato sauce and paste (not low-sodium or reduced-sodium). Regular tomato and vegetable juice (not low-sodium or reduced-sodium). Pickles. Olives.  Fruits  Canned fruit in a light or heavy syrup. Fried fruit. Fruit in cream or butter sauce.  Meat and other protein foods  Fatty cuts of meat. Ribs. Fried meat. Bacon. Sausage. Bologna and other processed lunch meats. Salami. Fatback. Hotdogs. Bratwurst. Salted nuts and seeds. Canned beans with added salt. Canned or smoked fish. Whole eggs or egg yolks. Chicken or turkey with skin.  Dairy  Whole or 2% milk, cream, and half-and-half. Whole or full-fat cream cheese. Whole-fat or sweetened yogurt. Full-fat cheese. Nondairy creamers. Whipped toppings.  Processed cheese and cheese spreads.  Fats and oils  Butter. Stick margarine. Lard. Shortening. Ghee. Bacon fat. Tropical oils, such as coconut, palm kernel, or palm oil.  Seasoning and other foods  Salted popcorn and pretzels. Onion salt, garlic salt, seasoned salt, table salt, and sea salt. Worcestershire sauce. Tartar sauce. Barbecue sauce. Teriyaki sauce. Soy sauce, including reduced-sodium. Steak sauce. Canned and packaged gravies. Fish sauce. Oyster sauce. Cocktail sauce. Horseradish that you find on the shelf. Ketchup. Mustard. Meat flavorings and tenderizers. Bouillon cubes. Hot sauce and Tabasco sauce. Premade or packaged marinades. Premade or packaged taco seasonings. Relishes. Regular salad dressings.  Where to find more information:   National Heart, Lung, and Blood Institute: www.nhlbi.nih.gov   American Heart Association: www.heart.org  Summary   The DASH eating plan is a healthy eating plan that has been shown to reduce high blood pressure (hypertension). It may also reduce your risk for type 2 diabetes, heart disease, and stroke.   With the   DASH eating plan, you should limit salt (sodium) intake to 2,300 mg a day. If you have hypertension, you may need to reduce your sodium intake to 1,500 mg a day.   When on the DASH eating plan, aim to eat more fresh fruits and vegetables, whole grains, lean proteins, low-fat dairy, and heart-healthy fats.   Work with your health care provider or diet and nutrition specialist (dietitian) to adjust your eating plan to your individual calorie needs.  This information is not intended to replace advice given to you by your health care provider. Make sure you discuss any questions you have with your health care provider.  Document Released: 08/13/2011 Document Revised: 08/17/2016 Document Reviewed: 08/17/2016  [employer] Interactive Patient Education  2019 [employer]

## 2018-10-21 DIAGNOSIS — R918 Other nonspecific abnormal finding of lung field: Secondary | ICD-10-CM | POA: Diagnosis not present

## 2018-10-21 DIAGNOSIS — Z08 Encounter for follow-up examination after completed treatment for malignant neoplasm: Secondary | ICD-10-CM | POA: Diagnosis not present

## 2018-10-21 DIAGNOSIS — C029 Malignant neoplasm of tongue, unspecified: Secondary | ICD-10-CM | POA: Diagnosis not present

## 2018-10-21 DIAGNOSIS — Z85810 Personal history of malignant neoplasm of tongue: Secondary | ICD-10-CM | POA: Diagnosis not present

## 2018-11-10 IMAGING — CT NM PET TUM IMG INITIAL (PI) SKULL BASE T - THIGH
8 series · 21 of 25 positions shown · non-contrast
Comparison: None.

CLINICAL DATA: Initial treatment strategy for head and neck
carcinoma.

EXAM:
NUCLEAR MEDICINE PET SKULL BASE TO THIGH
TECHNIQUE: 7.0 mCi F-18 FDG was injected intravenously. Full-ring PET imaging
was performed from the skull base to thigh after the radiotracer. CT
data was obtained and used for attenuation correction and anatomic
localization.
FASTING BLOOD GLUCOSE:  Value: 122 mg/dl

[Series 3: pet hn_sk_thigh ac · axial · 5.0mm · 4.07mm/px · z∈[-1376,-400]mm · 4 of 245 slices shown]
[im 1/245]
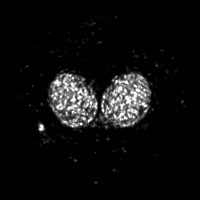
[im 62/245]
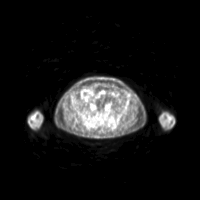
[im 123/245]
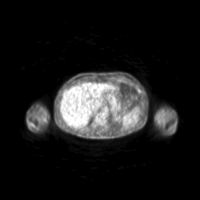
[im 245/245]
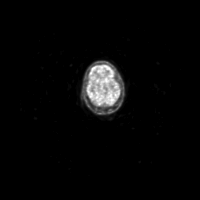

[Series 4: ct hn_sk_th 5.0 b31f · axial · 5.0mm · 0.98mm/px · z∈[-1376,-644]mm · 4 of 245 slices shown]
[im 1/245]
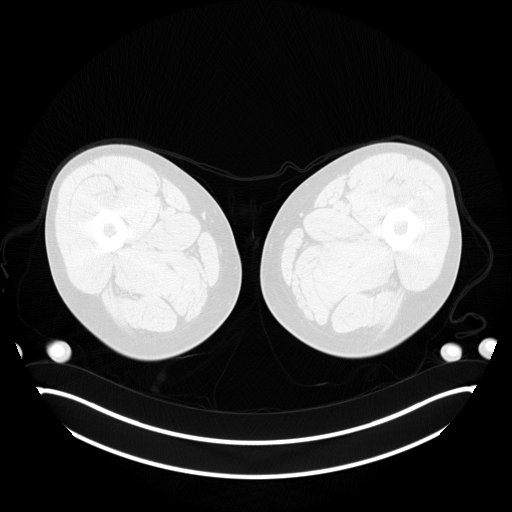
[im 62/245]
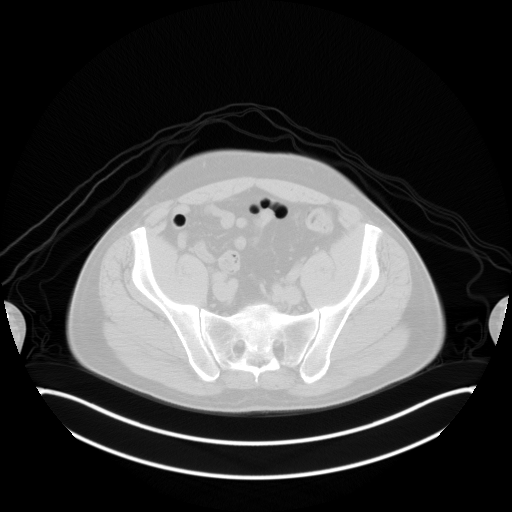
[im 123/245]
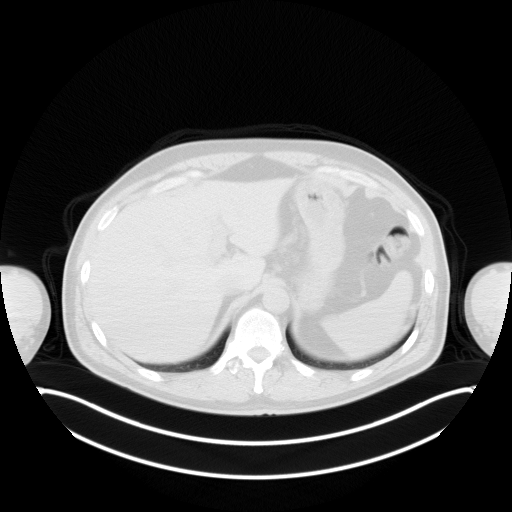
[im 184/245]
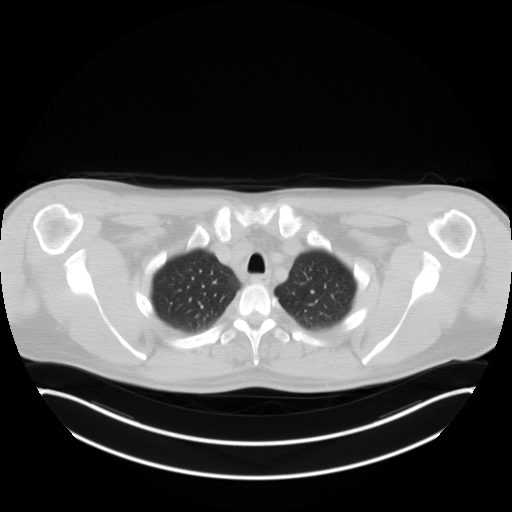

[Series 7: ct hn_sk_th 5.0 b70f (id)_bone · axial · 5.0mm · 0.77mm/px · z∈[-918,-570]mm · 2 of 88 slices shown]
[im 1/88  bone]
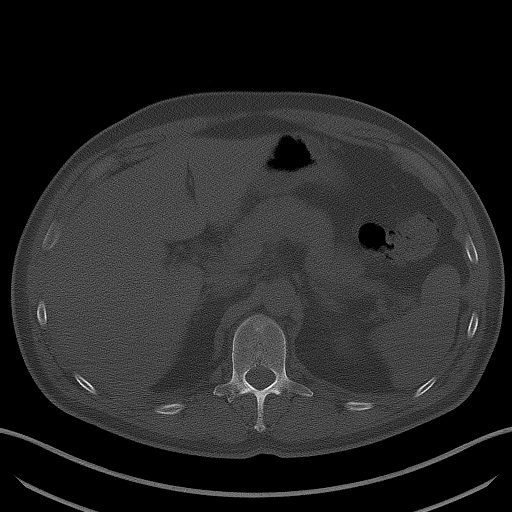
[im 88/88  bone]
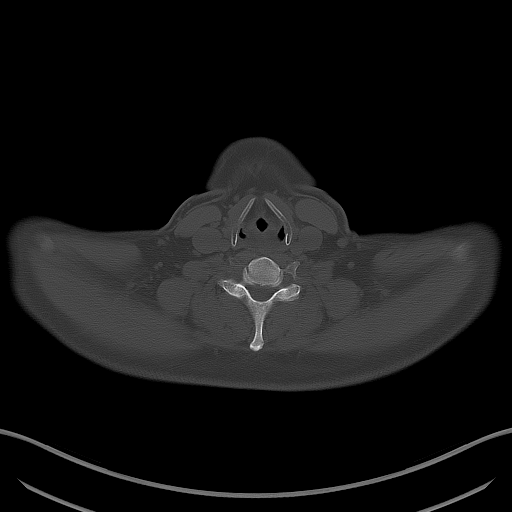

[Series 8: pet hn_sk_thigh nac · axial · 5.0mm · 4.07mm/px · z∈[-1376,-400]mm · 4 of 245 slices shown]
[im 1/245]
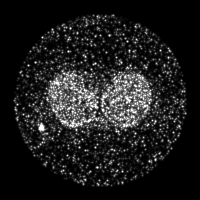
[im 62/245]
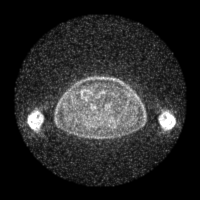
[im 123/245]
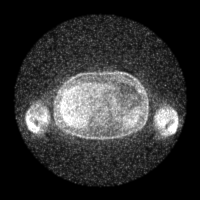
[im 245/245]
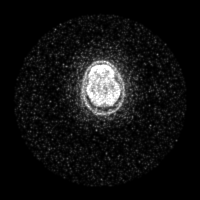

[Series 604: range-ct hn_sk_th 5.0 (id)<alpha range> · 1 of 66 slices shown (1 of 2)]
[im 1/66]
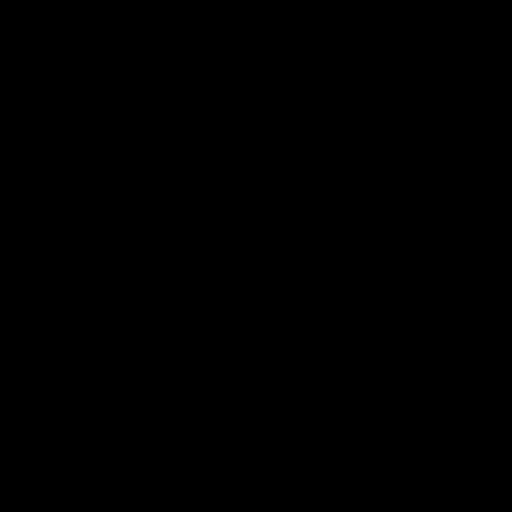

[Series 605: mip collection · coronal · 2.02mm/px · 1 of 32 slices shown]
[im 1/32]
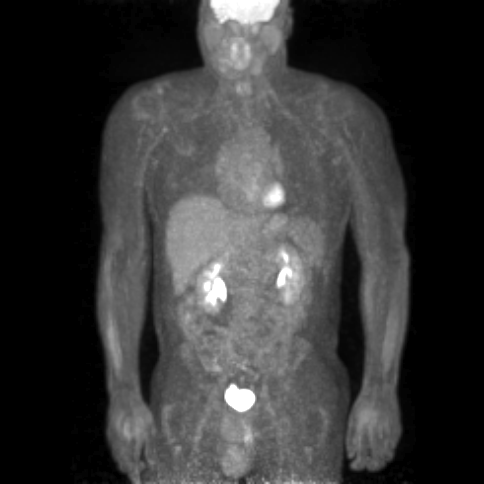

[Series 606: range-ct hn_sk_th 5.0 (id)<alpha range> · 4 of 235 slices shown (2 of 2)]
[im 1/235]
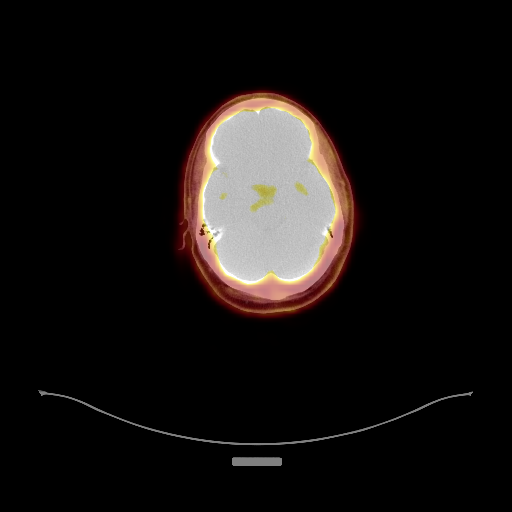
[im 59/235]
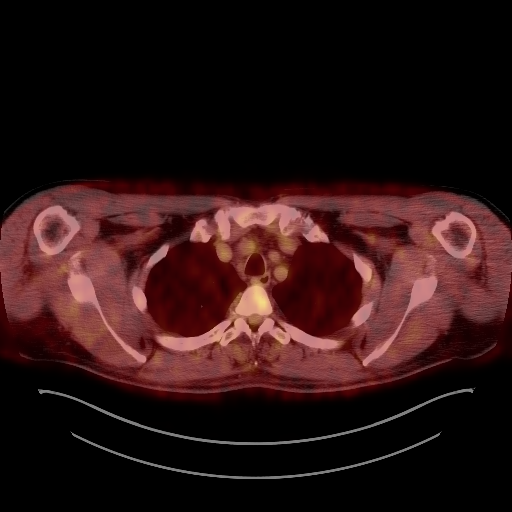
[im 176/235]
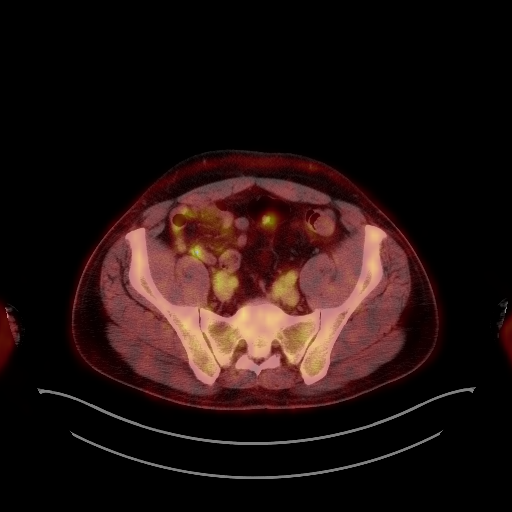
[im 235/235]
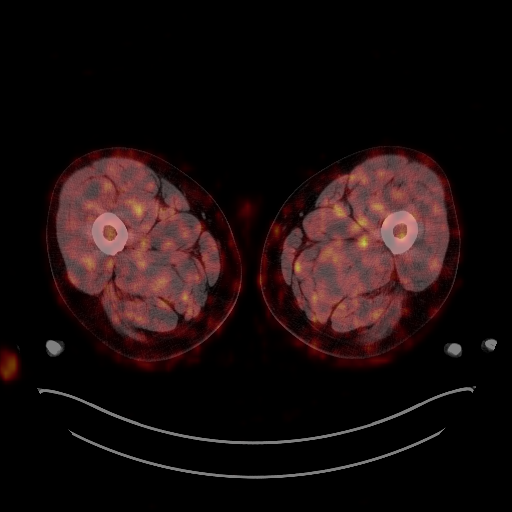

[Series 1032: results mm oncology reading · 0.51mm/px · 1 of 3 slices shown]
[im 1/3]
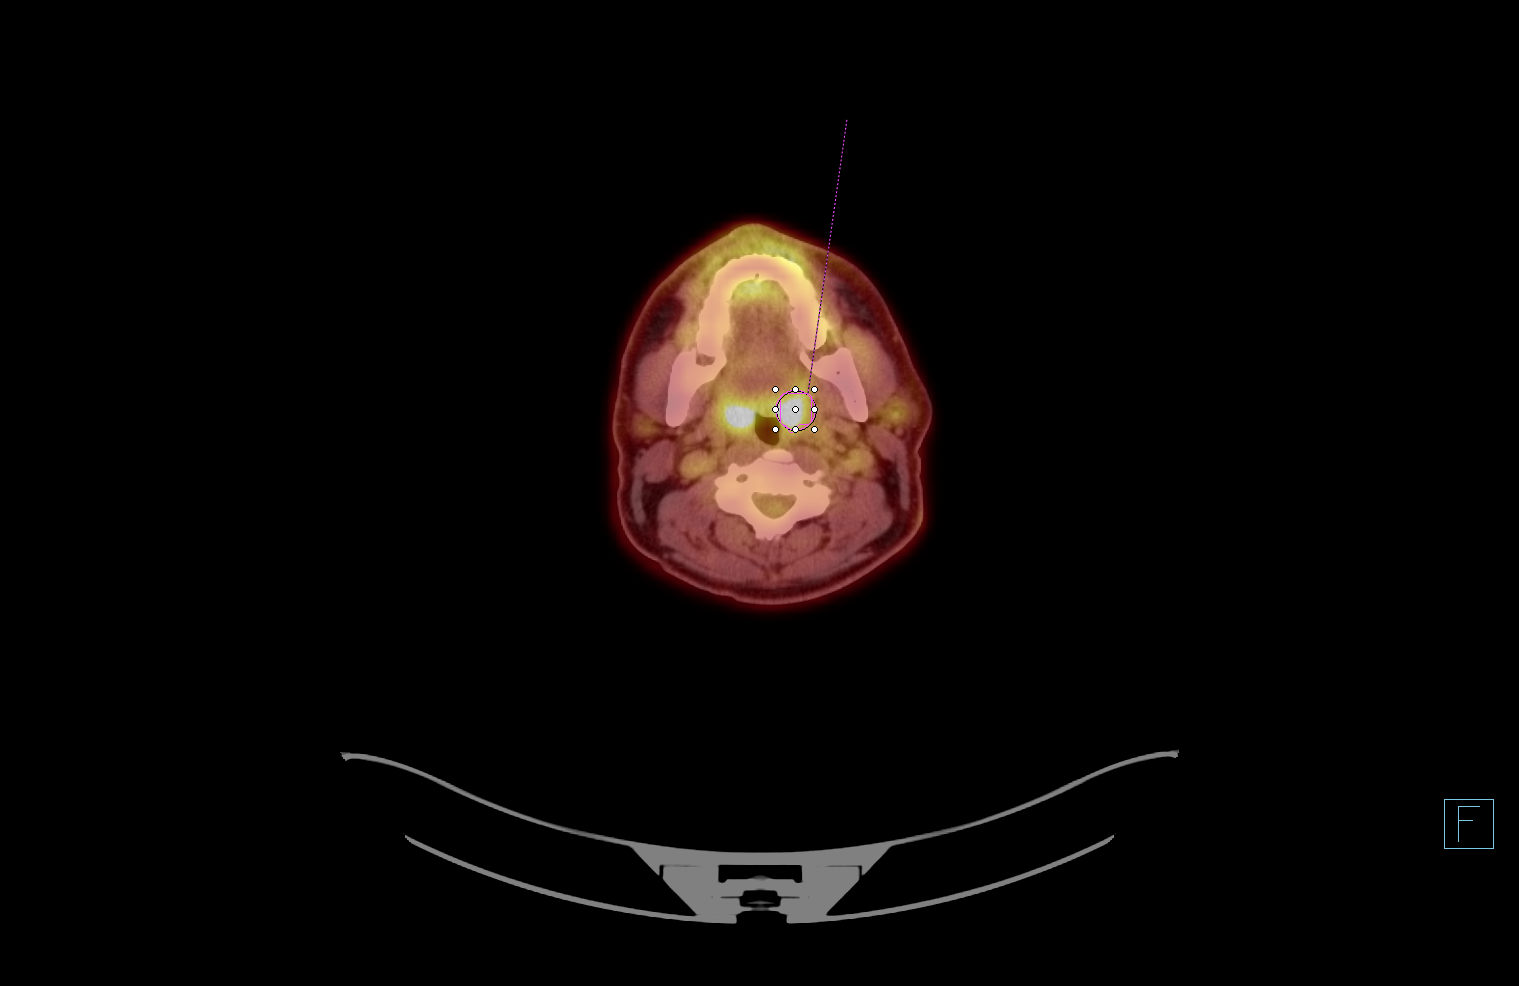

[21 of 25 positions shown; findings below may reference images not displayed]

FINDINGS: NECK

Within the central portion of the base of tongue there is focal
increased radiotracer uptake within SUV max equal to 5.57. Within
SUV max equal to 8.39 on the right and 7.7 on the left. There is a
right level-II lymph node which measures 7 mm and has an SUV max
equal to 3.29. No pathologically enlarged cervical lymph nodes
identified.

CHEST

No hypermetabolic mediastinal or hilar nodes. No suspicious
pulmonary nodules on the CT scan. Scar versus subsegmental
atelectasis noted in the right base.

ABDOMEN/PELVIS

No abnormal hypermetabolic activity within the liver, pancreas,
adrenal glands, or spleen. No hypermetabolic lymph nodes in the
abdomen or pelvis.

SKELETON

No focal hypermetabolic activity to suggest skeletal metastasis.
IMPRESSION: 1. Increased radiotracer uptake within base of tongue and bilateral
tonsillar regions identified as above.
2. Sub cm right level 2 lymph node exhibits equivocal increased
uptake.
3. No evidence for hypermetabolic distant metastatic disease.

## 2019-03-17 ENCOUNTER — Other Ambulatory Visit: Payer: Self-pay | Admitting: Family Medicine

## 2019-03-22 ENCOUNTER — Ambulatory Visit: Payer: Commercial Managed Care - PPO | Admitting: Family Medicine

## 2019-04-12 ENCOUNTER — Other Ambulatory Visit: Payer: Self-pay | Admitting: Family Medicine

## 2019-04-12 ENCOUNTER — Telehealth: Payer: Self-pay | Admitting: Family Medicine

## 2019-04-12 ENCOUNTER — Encounter: Payer: Self-pay | Admitting: Family Medicine

## 2019-04-12 DIAGNOSIS — I10 Essential (primary) hypertension: Secondary | ICD-10-CM

## 2019-04-12 DIAGNOSIS — Z23 Encounter for immunization: Secondary | ICD-10-CM

## 2019-04-12 DIAGNOSIS — Z79899 Other long term (current) drug therapy: Secondary | ICD-10-CM

## 2019-04-12 DIAGNOSIS — R5383 Other fatigue: Secondary | ICD-10-CM

## 2019-04-12 DIAGNOSIS — E785 Hyperlipidemia, unspecified: Secondary | ICD-10-CM

## 2019-04-12 NOTE — Telephone Encounter (Signed)
Patient has a physical in a few weeks and would like to get labwork done in advance at [facility].  Also would like to have his Thyroid checked. (Oncologist recommended he have it checked).

## 2019-04-12 NOTE — Telephone Encounter (Signed)
90-day on each without refills for now

## 2019-04-12 NOTE — Telephone Encounter (Signed)
Blood work ordered in Epic. Patient notified. 

## 2019-04-12 NOTE — Telephone Encounter (Signed)
Last labs 9/19: Lipid, Liver, Met 7, CBC, HIV

## 2019-04-12 NOTE — Telephone Encounter (Signed)
Lipid, liver, metabolic 7, TSH, free T4, CBC Wellness exam, hyperlipidemia, hypertension, other fatigue, history of squamous cell cancer

## 2019-05-09 LAB — HEPATIC FUNCTION PANEL
ALT: 33 IU/L (ref 0–44)
AST: 21 IU/L (ref 0–40)
Albumin: 4.5 g/dL (ref 4.0–5.0)
Alkaline Phosphatase: 56 IU/L (ref 39–117)
Bilirubin Total: 1.3 mg/dL — ABNORMAL HIGH (ref 0.0–1.2)
Bilirubin, Direct: 0.28 mg/dL (ref 0.00–0.40)
Total Protein: 6.8 g/dL (ref 6.0–8.5)

## 2019-05-09 LAB — CBC WITH DIFFERENTIAL/PLATELET
Basophils Absolute: 0 10*3/uL (ref 0.0–0.2)
Basos: 1 %
EOS (ABSOLUTE): 0.3 10*3/uL (ref 0.0–0.4)
Eos: 6 %
Hematocrit: 41.5 % (ref 37.5–51.0)
Hemoglobin: 14.1 g/dL (ref 13.0–17.7)
Immature Grans (Abs): 0 10*3/uL (ref 0.0–0.1)
Immature Granulocytes: 1 %
Lymphocytes Absolute: 0.7 10*3/uL (ref 0.7–3.1)
Lymphs: 17 %
MCH: 33.3 pg — ABNORMAL HIGH (ref 26.6–33.0)
MCHC: 34 g/dL (ref 31.5–35.7)
MCV: 98 fL — ABNORMAL HIGH (ref 79–97)
Monocytes Absolute: 0.6 10*3/uL (ref 0.1–0.9)
Monocytes: 14 %
Neutrophils Absolute: 2.6 10*3/uL (ref 1.4–7.0)
Neutrophils: 61 %
Platelets: 229 10*3/uL (ref 150–450)
RBC: 4.23 x10E6/uL (ref 4.14–5.80)
RDW: 12.9 % (ref 11.6–15.4)
WBC: 4.2 10*3/uL (ref 3.4–10.8)

## 2019-05-09 LAB — T4, FREE: Free T4: 0.97 ng/dL (ref 0.82–1.77)

## 2019-05-09 LAB — BASIC METABOLIC PANEL
BUN/Creatinine Ratio: 15 (ref 9–20)
BUN: 16 mg/dL (ref 6–24)
CO2: 23 mmol/L (ref 20–29)
Calcium: 9.6 mg/dL (ref 8.7–10.2)
Chloride: 100 mmol/L (ref 96–106)
Creatinine, Ser: 1.1 mg/dL (ref 0.76–1.27)
GFR calc Af Amer: 92 mL/min/{1.73_m2} (ref >59)
GFR calc non Af Amer: 80 mL/min/{1.73_m2} (ref >59)
Glucose: 117 mg/dL — ABNORMAL HIGH (ref 65–99)
Potassium: 4.2 mmol/L (ref 3.5–5.2)
Sodium: 137 mmol/L (ref 134–144)

## 2019-05-09 LAB — LIPID PANEL
Chol/HDL Ratio: 3.8 ratio (ref 0.0–5.0)
Cholesterol, Total: 191 mg/dL (ref 100–199)
HDL: 50 mg/dL (ref >39)
LDL Chol Calc (NIH): 112 mg/dL — ABNORMAL HIGH (ref 0–99)
Triglycerides: 163 mg/dL — ABNORMAL HIGH (ref 0–149)
VLDL Cholesterol Cal: 29 mg/dL (ref 5–40)

## 2019-05-09 LAB — TSH: TSH: 6.48 u[IU]/mL — ABNORMAL HIGH (ref 0.450–4.500)

## 2019-05-10 ENCOUNTER — Encounter: Payer: Self-pay | Admitting: Family Medicine

## 2019-05-10 ENCOUNTER — Ambulatory Visit (INDEPENDENT_AMBULATORY_CARE_PROVIDER_SITE_OTHER): Payer: Commercial Managed Care - PPO | Admitting: Family Medicine

## 2019-05-10 ENCOUNTER — Other Ambulatory Visit: Payer: Self-pay

## 2019-05-10 VITALS — BP 122/86 | Temp 97.2°F | Ht 76.5 in | Wt 240.4 lb

## 2019-05-10 DIAGNOSIS — E039 Hypothyroidism, unspecified: Secondary | ICD-10-CM

## 2019-05-10 DIAGNOSIS — I10 Essential (primary) hypertension: Secondary | ICD-10-CM | POA: Diagnosis not present

## 2019-05-10 DIAGNOSIS — Z23 Encounter for immunization: Secondary | ICD-10-CM | POA: Diagnosis not present

## 2019-05-10 DIAGNOSIS — R17 Unspecified jaundice: Secondary | ICD-10-CM | POA: Diagnosis not present

## 2019-05-10 DIAGNOSIS — E785 Hyperlipidemia, unspecified: Secondary | ICD-10-CM

## 2019-05-10 DIAGNOSIS — E038 Other specified hypothyroidism: Secondary | ICD-10-CM

## 2019-05-10 DIAGNOSIS — Z0000 Encounter for general adult medical examination without abnormal findings: Secondary | ICD-10-CM

## 2019-05-10 MED ORDER — LISINOPRIL-HYDROCHLOROTHIAZIDE 20-25 MG PO TABS: 1.0000 | ORAL_TABLET | Freq: Every day | ORAL | 1 refills | Status: DC

## 2019-05-10 MED ORDER — AMLODIPINE BESYLATE 5 MG PO TABS: 5.0000 mg | ORAL_TABLET | Freq: Every day | ORAL | 1 refills | Status: DC

## 2019-05-10 MED ORDER — SILDENAFIL CITRATE 20 MG PO TABS: ORAL_TABLET | ORAL | 9 refills | Status: DC

## 2019-05-10 MED ORDER — DOXAZOSIN MESYLATE 2 MG PO TABS: ORAL_TABLET | ORAL | 1 refills | Status: DC

## 2019-05-10 NOTE — Progress Notes (Addendum)
Subjective:    Patient ID: Christopher Buckley, male    DOB: 07/10/1971, 48 y.o.   MRN: [id number]  HPI The patient comes in today for a wellness visit.    A review of their health history was completed.  A review of medications was also completed.  Any needed refills; pt needing refills on all meds  Eating habits: tries to eat healthy; pt taste has came back since cancer so he has been enjoying food  Falls/  MVA accidents in past few months: none  Regular exercise: exercises regular  Specialist pt sees on regular basis: cancer doctor  Preventative health issues were discussed.   Additional concerns: pt would like flu shot; pt had bw completed.  Patient for blood pressure check up.  The patient does have hypertension.  The patient is on medication.  Patient relates compliance with meds. Todays BP reviewed with the patient. Patient denies issues with medication. Patient relates reasonable diet. Patient tries to minimize salt. Patient aware of BP goals.  We did discuss blood pressure discussed importance of medications blood pressure today the readings look good he is due for refills refills were given  Patient does have erectile dysfunction refills given regarding this  We did discuss subclinical hypothyroidism in detail and the importance of monitoring this  Patient denies any liver symptoms but his bilirubin slightly elevated we will follow this Results for orders placed or performed in visit on 04/12/19  Lipid panel  Result Value Ref Range   Cholesterol, Total 191 100 - 199 mg/dL   Triglycerides 163 (H) 0 - 149 mg/dL   HDL 50 >39 mg/dL   VLDL Cholesterol Cal 29 5 - 40 mg/dL   LDL Chol Calc (NIH) 112 (H) 0 - 99 mg/dL   Lipid Comment: CANCELED    Chol/HDL Ratio 3.8 0.0 - 5.0 ratio  Hepatic function panel  Result Value Ref Range   Total Protein 6.8 6.0 - 8.5 g/dL   Albumin 4.5 4.0 - 5.0 g/dL   Bilirubin Total 1.3 (H) 0.0 - 1.2 mg/dL   Bilirubin, Direct 0.28 0.00 - 0.40  mg/dL   Alkaline Phosphatase 56 39 - 117 IU/L   AST 21 0 - 40 IU/L   ALT 33 0 - 44 IU/L  Basic metabolic panel  Result Value Ref Range   Glucose 117 (H) 65 - 99 mg/dL   BUN 16 6 - 24 mg/dL   Creatinine, Ser 1.10 0.76 - 1.27 mg/dL   GFR calc non Af Amer 80 >59 mL/min/1.73   GFR calc Af Amer 92 >59 mL/min/1.73   BUN/Creatinine Ratio 15 9 - 20   Sodium 137 134 - 144 mmol/L   Potassium 4.2 3.5 - 5.2 mmol/L   Chloride 100 96 - 106 mmol/L   CO2 23 20 - 29 mmol/L   Calcium 9.6 8.7 - 10.2 mg/dL  TSH  Result Value Ref Range   TSH 6.480 (H) 0.450 - 4.500 uIU/mL  T4, free  Result Value Ref Range   Free T4 0.97 0.82 - 1.77 ng/dL  CBC with Differential/Platelet  Result Value Ref Range   WBC 4.2 3.4 - 10.8 x10E3/uL   RBC 4.23 4.14 - 5.80 x10E6/uL   Hemoglobin 14.1 13.0 - 17.7 g/dL   Hematocrit 41.5 37.5 - 51.0 %   MCV 98 (H) 79 - 97 fL   MCH 33.3 (H) 26.6 - 33.0 pg   MCHC 34.0 31.5 - 35.7 g/dL   RDW 12.9 11.6 - 15.4 %  Platelets 229 150 - 450 x10E3/uL   Neutrophils 61 Not Estab. %   Lymphs 17 Not Estab. %   Monocytes 14 Not Estab. %   Eos 6 Not Estab. %   Basos 1 Not Estab. %   Neutrophils Absolute 2.6 1.4 - 7.0 x10E3/uL   Lymphocytes Absolute 0.7 0.7 - 3.1 x10E3/uL   Monocytes Absolute 0.6 0.1 - 0.9 x10E3/uL   EOS (ABSOLUTE) 0.3 0.0 - 0.4 x10E3/uL   Basophils Absolute 0.0 0.0 - 0.2 x10E3/uL   Immature Granulocytes 1 Not Estab. %   Immature Grans (Abs) 0.0 0.0 - 0.1 x10E3/uL    Review of Systems  Constitutional: Negative for activity change, appetite change and fever.  HENT: Negative for congestion and rhinorrhea.   Eyes: Negative for discharge.  Respiratory: Negative for cough and wheezing.   Cardiovascular: Negative for chest pain.  Gastrointestinal: Negative for abdominal pain, blood in stool and vomiting.  Genitourinary: Negative for difficulty urinating and frequency.  Musculoskeletal: Negative for neck pain.  Skin: Negative for rash.  Allergic/Immunologic: Negative  for environmental allergies and food allergies.  Neurological: Negative for weakness and headaches.  Psychiatric/Behavioral: Negative for agitation.       Objective:   Physical Exam Constitutional:      Appearance: He is well-developed.  HENT:     Head: Normocephalic and atraumatic.     Right Ear: External ear normal.     Left Ear: External ear normal.     Nose: Nose normal.  Eyes:     Pupils: Pupils are equal, round, and reactive to light.  Neck:     Musculoskeletal: Normal range of motion and neck supple.     Thyroid: No thyromegaly.  Cardiovascular:     Rate and Rhythm: Normal rate and regular rhythm.     Heart sounds: Normal heart sounds. No murmur.  Pulmonary:     Effort: Pulmonary effort is normal. No respiratory distress.     Breath sounds: Normal breath sounds. No wheezing.  Abdominal:     General: Bowel sounds are normal. There is no distension.     Palpations: Abdomen is soft. There is no mass.     Tenderness: There is no abdominal tenderness.  Genitourinary:    Penis: Normal.   Musculoskeletal: Normal range of motion.  Lymphadenopathy:     Cervical: No cervical adenopathy.  Skin:    General: Skin is warm and dry.     Findings: No erythema.  Neurological:     Mental Status: He is alert.     Motor: No abnormal muscle tone.  Psychiatric:        Behavior: Behavior normal.        Judgment: Judgment normal.           Assessment & Plan:  1. Essential hypertension, benign Blood pressure decent control continue current medication refills given watch diet stay active stay away from smoking as he has been - Hemoglobin A1c - TSH - T4, free - Hepatic function panel  2. Hyperlipidemia, unspecified hyperlipidemia type Hyperlipidemia not at the level that we need to start a statin but nonetheless is very important for the patient maintain a healthy diet - Hemoglobin A1c - TSH - T4, free - Hepatic function panel  3. Elevated bilirubin Elevated bilirubin I  doubt that this is a serious issue but given his history I recommend repeating liver profile in approximately 4 months if it is progressive we will need to do ultrasound or scan - Hemoglobin A1c - TSH -  T4, free - Hepatic function panel  4. Subclinical hypothyroidism Patient with subclinical hypothyroidism repeat it again in 4 months may need to be on medication but currently not necessary - Hemoglobin A1c - TSH - T4, free - Hepatic function panel  5. Well adult exam Adult wellness-complete.wellness physical was conducted today. Importance of diet and exercise were discussed in detail.  In addition to this a discussion regarding safety was also covered. We also reviewed over immunizations and gave recommendations regarding current immunization needed for age.  In addition to this additional areas were also touched on including: Preventative health exams needed:  Colonoscopy colonoscopy not indicated prostate exam not indicated  Patient was advised yearly wellness exam  - Hemoglobin A1c - TSH - T4, free - Hepatic function panel  6. Need for vaccination Flu shot today - Flu Vaccine QUAD 6+ mos PF IM (Fluarix Quad PF)  Should be noted that the discussion of subclinical hypothyroidism as well as blood pressure refills of blood pressure medicine and discussion of blood pressure falls outside the realm of wellness. 15 minutes was spent with patient today discussing healthcare issues which they came.  More than 50% of this visit-total duration of visit-was spent in counseling and coordination of care.  Please see diagnosis regarding the focus of this coordination and care

## 2019-10-06 DIAGNOSIS — R918 Other nonspecific abnormal finding of lung field: Secondary | ICD-10-CM | POA: Diagnosis not present

## 2019-10-06 DIAGNOSIS — Z85810 Personal history of malignant neoplasm of tongue: Secondary | ICD-10-CM | POA: Diagnosis not present

## 2019-10-06 DIAGNOSIS — C029 Malignant neoplasm of tongue, unspecified: Secondary | ICD-10-CM | POA: Diagnosis not present

## 2019-12-02 ENCOUNTER — Other Ambulatory Visit: Payer: Self-pay | Admitting: Family Medicine

## 2020-01-08 DIAGNOSIS — Z923 Personal history of irradiation: Secondary | ICD-10-CM | POA: Diagnosis not present

## 2020-01-08 DIAGNOSIS — C029 Malignant neoplasm of tongue, unspecified: Secondary | ICD-10-CM | POA: Diagnosis not present

## 2020-01-08 DIAGNOSIS — Z08 Encounter for follow-up examination after completed treatment for malignant neoplasm: Secondary | ICD-10-CM | POA: Diagnosis not present

## 2020-01-08 DIAGNOSIS — Z85810 Personal history of malignant neoplasm of tongue: Secondary | ICD-10-CM | POA: Diagnosis not present

## 2020-01-08 DIAGNOSIS — R918 Other nonspecific abnormal finding of lung field: Secondary | ICD-10-CM | POA: Diagnosis not present

## 2020-01-19 ENCOUNTER — Other Ambulatory Visit: Payer: Self-pay | Admitting: Family Medicine

## 2020-02-13 ENCOUNTER — Other Ambulatory Visit: Payer: Self-pay | Admitting: Family Medicine

## 2020-02-13 NOTE — Telephone Encounter (Signed)
Scheduled 7/8

## 2020-02-13 NOTE — Telephone Encounter (Signed)
Please contact patient to set up appt; then may route back to nurses. Thank you  

## 2020-02-25 ENCOUNTER — Other Ambulatory Visit: Payer: Self-pay | Admitting: Family Medicine

## 2020-03-08 ENCOUNTER — Other Ambulatory Visit: Payer: Self-pay | Admitting: Family Medicine

## 2020-03-14 ENCOUNTER — Ambulatory Visit (INDEPENDENT_AMBULATORY_CARE_PROVIDER_SITE_OTHER): Payer: BC Managed Care – PPO | Admitting: Family Medicine

## 2020-03-14 ENCOUNTER — Other Ambulatory Visit: Payer: Self-pay

## 2020-03-14 VITALS — BP 130/86 | Temp 97.8°F | Wt 244.4 lb

## 2020-03-14 DIAGNOSIS — E782 Mixed hyperlipidemia: Secondary | ICD-10-CM

## 2020-03-14 DIAGNOSIS — D708 Other neutropenia: Secondary | ICD-10-CM

## 2020-03-14 DIAGNOSIS — E038 Other specified hypothyroidism: Secondary | ICD-10-CM

## 2020-03-14 DIAGNOSIS — I10 Essential (primary) hypertension: Secondary | ICD-10-CM | POA: Diagnosis not present

## 2020-03-14 DIAGNOSIS — E039 Hypothyroidism, unspecified: Secondary | ICD-10-CM | POA: Diagnosis not present

## 2020-03-14 DIAGNOSIS — Z79899 Other long term (current) drug therapy: Secondary | ICD-10-CM

## 2020-03-14 MED ORDER — LISINOPRIL-HYDROCHLOROTHIAZIDE 20-25 MG PO TABS: 1.0000 | ORAL_TABLET | Freq: Every day | ORAL | 1 refills | Status: DC

## 2020-03-14 MED ORDER — DOXAZOSIN MESYLATE 2 MG PO TABS: ORAL_TABLET | ORAL | 1 refills | Status: DC

## 2020-03-14 MED ORDER — AMLODIPINE BESYLATE 5 MG PO TABS: 5.0000 mg | ORAL_TABLET | Freq: Every day | ORAL | 1 refills | Status: DC

## 2020-03-14 NOTE — Progress Notes (Signed)
   Subjective:    Patient ID: VINCENT STREATER, male    DOB: 28-Mar-1971, 49 y.o.   MRN: [id number]  HPI Patient comes in today for follow up on his medications. Patient has history of oral cancer so far it is staying away it is being followed by [facility] on a regular basis every 6 months recent scan looked good Patient does have blood pressure issues he does take medication as directed No concerns.    Review of Systems See above denies chest tightness pressure pain shortness of breath    Objective:   Physical Exam Lungs are clear heart regular HEENT benign       Assessment & Plan:  1. Essential hypertension, benign Blood pressure good control could be a little bit better patient is to reengage with walking minimizing salt - Basic metabolic panel - CBC with Differential/Platelet  2. Mixed hyperlipidemia Lipid ordered follow-up for wellness in September - Lipid panel  3. Subclinical hypothyroidism Thyroid follow-up recommended - TSH - T4, free  4. Other neutropenia ([place]) Patient had low WBC on the previous lab count from the hospital so therefore a repeat test - Hepatic function panel - CBC with Differential/Platelet  5. High risk medication use Labs ordered - Hepatic function panel Follow-up wellness in September

## 2020-03-14 NOTE — Patient Instructions (Signed)
DASH Eating Plan DASH stands for "Dietary Approaches to Stop Hypertension." The DASH eating plan is a healthy eating plan that has been shown to reduce high blood pressure (hypertension). It may also reduce your risk for type 2 diabetes, heart disease, and stroke. The DASH eating plan may also help with weight loss. What are tips for following this plan?  General guidelines  Avoid eating more than 2,300 mg (milligrams) of salt (sodium) a day. If you have hypertension, you may need to reduce your sodium intake to 1,500 mg a day.  Limit alcohol intake to no more than 1 drink a day for nonpregnant women and 2 drinks a day for men. One drink equals 12 oz of beer, 5 oz of wine, or 1 oz of hard liquor.  Work with your health care provider to maintain a healthy body weight or to lose weight. Ask what an ideal weight is for you.  Get at least 30 minutes of exercise that causes your heart to beat faster (aerobic exercise) most days of the week. Activities may include walking, swimming, or biking.  Work with your health care provider or diet and nutrition specialist (dietitian) to adjust your eating plan to your individual calorie needs. Reading food labels   Check food labels for the amount of sodium per serving. Choose foods with less than 5 percent of the Daily Value of sodium. Generally, foods with less than 300 mg of sodium per serving fit into this eating plan.  To find whole grains, look for the word "whole" as the first word in the ingredient list. Shopping  Buy products labeled as "low-sodium" or "no salt added."  Buy fresh foods. Avoid canned foods and premade or frozen meals. Cooking  Avoid adding salt when cooking. Use salt-free seasonings or herbs instead of table salt or sea salt. Check with your health care provider or pharmacist before using salt substitutes.  Do not fry foods. Cook foods using healthy methods such as baking, boiling, grilling, and broiling instead.  Cook with  heart-healthy oils, such as olive, canola, soybean, or sunflower oil. Meal planning  Eat a balanced diet that includes: ? 5 or more servings of fruits and vegetables each day. At each meal, try to fill half of your plate with fruits and vegetables. ? Up to 6-8 servings of whole grains each day. ? Less than 6 oz of lean meat, poultry, or fish each day. A 3-oz serving of meat is about the same size as a deck of cards. One egg equals 1 oz. ? 2 servings of low-fat dairy each day. ? A serving of nuts, seeds, or beans 5 times each week. ? Heart-healthy fats. Healthy fats called Omega-3 fatty acids are found in foods such as flaxseeds and coldwater fish, like sardines, salmon, and mackerel.  Limit how much you eat of the following: ? Canned or prepackaged foods. ? Food that is high in trans fat, such as fried foods. ? Food that is high in saturated fat, such as fatty meat. ? Sweets, desserts, sugary drinks, and other foods with added sugar. ? Full-fat dairy products.  Do not salt foods before eating.  Try to eat at least 2 vegetarian meals each week.  Eat more home-cooked food and less restaurant, buffet, and fast food.  When eating at a restaurant, ask that your food be prepared with less salt or no salt, if possible. What foods are recommended? The items listed may not be a complete list. Talk with your dietitian about   what dietary choices are best for you. Grains Whole-grain or whole-wheat bread. Whole-grain or whole-wheat pasta. Brown rice. Oatmeal. Quinoa. Bulgur. Whole-grain and low-sodium cereals. Pita bread. Low-fat, low-sodium crackers. Whole-wheat flour tortillas. Vegetables Fresh or frozen vegetables (raw, steamed, roasted, or grilled). Low-sodium or reduced-sodium tomato and vegetable juice. Low-sodium or reduced-sodium tomato sauce and tomato paste. Low-sodium or reduced-sodium canned vegetables. Fruits All fresh, dried, or frozen fruit. Canned fruit in natural juice (without  added sugar). Meat and other protein foods Skinless chicken or turkey. Ground chicken or turkey. Pork with fat trimmed off. Fish and seafood. Egg whites. Dried beans, peas, or lentils. Unsalted nuts, nut butters, and seeds. Unsalted canned beans. Lean cuts of beef with fat trimmed off. Low-sodium, lean deli meat. Dairy Low-fat (1%) or fat-free (skim) milk. Fat-free, low-fat, or reduced-fat cheeses. Nonfat, low-sodium ricotta or cottage cheese. Low-fat or nonfat yogurt. Low-fat, low-sodium cheese. Fats and oils Soft margarine without trans fats. Vegetable oil. Low-fat, reduced-fat, or light mayonnaise and salad dressings (reduced-sodium). Canola, safflower, olive, soybean, and sunflower oils. Avocado. Seasoning and other foods Herbs. Spices. Seasoning mixes without salt. Unsalted popcorn and pretzels. Fat-free sweets. What foods are not recommended? The items listed may not be a complete list. Talk with your dietitian about what dietary choices are best for you. Grains Baked goods made with fat, such as croissants, muffins, or some breads. Dry pasta or rice meal packs. Vegetables Creamed or fried vegetables. Vegetables in a cheese sauce. Regular canned vegetables (not low-sodium or reduced-sodium). Regular canned tomato sauce and paste (not low-sodium or reduced-sodium). Regular tomato and vegetable juice (not low-sodium or reduced-sodium). Pickles. Olives. Fruits Canned fruit in a light or heavy syrup. Fried fruit. Fruit in cream or butter sauce. Meat and other protein foods Fatty cuts of meat. Ribs. Fried meat. Bacon. Sausage. Bologna and other processed lunch meats. Salami. Fatback. Hotdogs. Bratwurst. Salted nuts and seeds. Canned beans with added salt. Canned or smoked fish. Whole eggs or egg yolks. Chicken or turkey with skin. Dairy Whole or 2% milk, cream, and half-and-half. Whole or full-fat cream cheese. Whole-fat or sweetened yogurt. Full-fat cheese. Nondairy creamers. Whipped toppings.  Processed cheese and cheese spreads. Fats and oils Butter. Stick margarine. Lard. Shortening. Ghee. Bacon fat. Tropical oils, such as coconut, palm kernel, or palm oil. Seasoning and other foods Salted popcorn and pretzels. Onion salt, garlic salt, seasoned salt, table salt, and sea salt. Worcestershire sauce. Tartar sauce. Barbecue sauce. Teriyaki sauce. Soy sauce, including reduced-sodium. Steak sauce. Canned and packaged gravies. Fish sauce. Oyster sauce. Cocktail sauce. Horseradish that you find on the shelf. Ketchup. Mustard. Meat flavorings and tenderizers. Bouillon cubes. Hot sauce and Tabasco sauce. Premade or packaged marinades. Premade or packaged taco seasonings. Relishes. Regular salad dressings. Where to find more information:  National Heart, Lung, and Blood Institute: www.nhlbi.nih.gov  American Heart Association: www.heart.org Summary  The DASH eating plan is a healthy eating plan that has been shown to reduce high blood pressure (hypertension). It may also reduce your risk for type 2 diabetes, heart disease, and stroke.  With the DASH eating plan, you should limit salt (sodium) intake to 2,300 mg a day. If you have hypertension, you may need to reduce your sodium intake to 1,500 mg a day.  When on the DASH eating plan, aim to eat more fresh fruits and vegetables, whole grains, lean proteins, low-fat dairy, and heart-healthy fats.  Work with your health care provider or diet and nutrition specialist (dietitian) to adjust your eating plan to your   individual calorie needs. This information is not intended to replace advice given to you by your health care provider. Make sure you discuss any questions you have with your health care provider. Document Revised: 08/06/2017 Document Reviewed: 08/17/2016 Elsevier Patient Education  2020 [employer]  

## 2020-04-09 DIAGNOSIS — R1312 Dysphagia, oropharyngeal phase: Secondary | ICD-10-CM | POA: Diagnosis not present

## 2020-04-09 DIAGNOSIS — C029 Malignant neoplasm of tongue, unspecified: Secondary | ICD-10-CM | POA: Diagnosis not present

## 2020-04-09 DIAGNOSIS — Z85810 Personal history of malignant neoplasm of tongue: Secondary | ICD-10-CM | POA: Diagnosis not present

## 2020-04-09 DIAGNOSIS — R633 Feeding difficulties: Secondary | ICD-10-CM | POA: Diagnosis not present

## 2020-04-09 DIAGNOSIS — R1319 Other dysphagia: Secondary | ICD-10-CM | POA: Diagnosis not present

## 2020-04-18 ENCOUNTER — Encounter: Payer: Self-pay | Admitting: Family Medicine

## 2020-05-09 DIAGNOSIS — E039 Hypothyroidism, unspecified: Secondary | ICD-10-CM | POA: Diagnosis not present

## 2020-05-09 DIAGNOSIS — I10 Essential (primary) hypertension: Secondary | ICD-10-CM | POA: Diagnosis not present

## 2020-05-09 DIAGNOSIS — Z79899 Other long term (current) drug therapy: Secondary | ICD-10-CM | POA: Diagnosis not present

## 2020-05-09 DIAGNOSIS — E782 Mixed hyperlipidemia: Secondary | ICD-10-CM | POA: Diagnosis not present

## 2020-05-09 DIAGNOSIS — D708 Other neutropenia: Secondary | ICD-10-CM | POA: Diagnosis not present

## 2020-05-10 LAB — HEPATIC FUNCTION PANEL
ALT: 26 IU/L (ref 0–44)
AST: 18 IU/L (ref 0–40)
Albumin: 4.8 g/dL (ref 4.0–5.0)
Alkaline Phosphatase: 58 IU/L (ref 48–121)
Bilirubin Total: 1 mg/dL (ref 0.0–1.2)
Bilirubin, Direct: 0.22 mg/dL (ref 0.00–0.40)
Total Protein: 6.8 g/dL (ref 6.0–8.5)

## 2020-05-10 LAB — CBC WITH DIFFERENTIAL/PLATELET
Basophils Absolute: 0.1 10*3/uL (ref 0.0–0.2)
Basos: 1 %
EOS (ABSOLUTE): 0.2 10*3/uL (ref 0.0–0.4)
Eos: 4 %
Hematocrit: 41.6 % (ref 37.5–51.0)
Hemoglobin: 14.3 g/dL (ref 13.0–17.7)
Immature Grans (Abs): 0 10*3/uL (ref 0.0–0.1)
Immature Granulocytes: 0 %
Lymphocytes Absolute: 0.7 10*3/uL (ref 0.7–3.1)
Lymphs: 12 %
MCH: 32.8 pg (ref 26.6–33.0)
MCHC: 34.4 g/dL (ref 31.5–35.7)
MCV: 95 fL (ref 79–97)
Monocytes Absolute: 0.6 10*3/uL (ref 0.1–0.9)
Monocytes: 11 %
Neutrophils Absolute: 3.8 10*3/uL (ref 1.4–7.0)
Neutrophils: 72 %
Platelets: 210 10*3/uL (ref 150–450)
RBC: 4.36 x10E6/uL (ref 4.14–5.80)
RDW: 12.3 % (ref 11.6–15.4)
WBC: 5.4 10*3/uL (ref 3.4–10.8)

## 2020-05-10 LAB — LIPID PANEL
Chol/HDL Ratio: 4.3 ratio (ref 0.0–5.0)
Cholesterol, Total: 191 mg/dL (ref 100–199)
HDL: 44 mg/dL (ref >39)
LDL Chol Calc (NIH): 116 mg/dL — ABNORMAL HIGH (ref 0–99)
Triglycerides: 177 mg/dL — ABNORMAL HIGH (ref 0–149)
VLDL Cholesterol Cal: 31 mg/dL (ref 5–40)

## 2020-05-10 LAB — BASIC METABOLIC PANEL
BUN/Creatinine Ratio: 14 (ref 9–20)
BUN: 14 mg/dL (ref 6–24)
CO2: 24 mmol/L (ref 20–29)
Calcium: 9.7 mg/dL (ref 8.7–10.2)
Chloride: 98 mmol/L (ref 96–106)
Creatinine, Ser: 0.97 mg/dL (ref 0.76–1.27)
GFR calc Af Amer: 106 mL/min/{1.73_m2} (ref >59)
GFR calc non Af Amer: 92 mL/min/{1.73_m2} (ref >59)
Glucose: 109 mg/dL — ABNORMAL HIGH (ref 65–99)
Potassium: 4.2 mmol/L (ref 3.5–5.2)
Sodium: 135 mmol/L (ref 134–144)

## 2020-05-10 LAB — T4, FREE: Free T4: 0.93 ng/dL (ref 0.82–1.77)

## 2020-05-10 LAB — TSH: TSH: 8.08 u[IU]/mL — ABNORMAL HIGH (ref 0.450–4.500)

## 2020-05-15 ENCOUNTER — Encounter: Payer: Self-pay | Admitting: Family Medicine

## 2020-05-15 ENCOUNTER — Ambulatory Visit (INDEPENDENT_AMBULATORY_CARE_PROVIDER_SITE_OTHER): Payer: BC Managed Care – PPO | Admitting: Family Medicine

## 2020-05-15 ENCOUNTER — Other Ambulatory Visit: Payer: Self-pay

## 2020-05-15 VITALS — BP 122/78 | HR 83 | Temp 97.9°F | Ht 77.0 in | Wt 242.0 lb

## 2020-05-15 DIAGNOSIS — Z0000 Encounter for general adult medical examination without abnormal findings: Secondary | ICD-10-CM

## 2020-05-15 DIAGNOSIS — E039 Hypothyroidism, unspecified: Secondary | ICD-10-CM | POA: Diagnosis not present

## 2020-05-15 DIAGNOSIS — E038 Other specified hypothyroidism: Secondary | ICD-10-CM

## 2020-05-15 DIAGNOSIS — Z23 Encounter for immunization: Secondary | ICD-10-CM | POA: Diagnosis not present

## 2020-05-15 DIAGNOSIS — I10 Essential (primary) hypertension: Secondary | ICD-10-CM | POA: Diagnosis not present

## 2020-05-15 NOTE — Progress Notes (Signed)
Subjective:    Patient ID: Christopher Buckley, male    DOB: 16-Apr-1971, 49 y.o.   MRN: [id number]  HPI The patient comes in today for a wellness visit.  Patient does take his blood pressure medicine regular basis does try to eat healthy.  Followed by his specialist.  A review of their health history was completed.  A review of medications was also completed.  Any needed refills; none  Eating habits: health conscious  Falls/  MVA accidents in past few months: none  Regular exercise: none  Specialist pt sees on regular basis: none  Preventative health issues were discussed.   Additional concerns: none    Review of Systems  Constitutional: Negative for diaphoresis and fatigue.  HENT: Negative for congestion and rhinorrhea.   Respiratory: Negative for cough and shortness of breath.   Cardiovascular: Negative for chest pain and leg swelling.  Gastrointestinal: Negative for abdominal pain and diarrhea.  Skin: Negative for color change and rash.  Neurological: Negative for dizziness and headaches.  Psychiatric/Behavioral: Negative for behavioral problems and confusion.       Objective:   Physical Exam Vitals reviewed.  Constitutional:      General: He is not in acute distress. HENT:     Head: Normocephalic and atraumatic.  Eyes:     General:        Right eye: No discharge.        Left eye: No discharge.  Neck:     Trachea: No tracheal deviation.  Cardiovascular:     Rate and Rhythm: Normal rate and regular rhythm.     Heart sounds: Normal heart sounds. No murmur heard.   Pulmonary:     Effort: Pulmonary effort is normal. No respiratory distress.     Breath sounds: Normal breath sounds.  Lymphadenopathy:     Cervical: No cervical adenopathy.  Skin:    General: Skin is warm and dry.  Neurological:     Mental Status: He is alert.     Coordination: Coordination normal.  Psychiatric:        Behavior: Behavior normal.    Results for orders placed or performed  in visit on 03/14/20  TSH  Result Value Ref Range   TSH 8.080 (H) 0.450 - 4.500 uIU/mL  T4, free  Result Value Ref Range   Free T4 0.93 0.82 - 1.77 ng/dL  Basic metabolic panel  Result Value Ref Range   Glucose 109 (H) 65 - 99 mg/dL   BUN 14 6 - 24 mg/dL   Creatinine, Ser 0.97 0.76 - 1.27 mg/dL   GFR calc non Af Amer 92 >59 mL/min/1.73   GFR calc Af Amer 106 >59 mL/min/1.73   BUN/Creatinine Ratio 14 9 - 20   Sodium 135 134 - 144 mmol/L   Potassium 4.2 3.5 - 5.2 mmol/L   Chloride 98 96 - 106 mmol/L   CO2 24 20 - 29 mmol/L   Calcium 9.7 8.7 - 10.2 mg/dL  Lipid panel  Result Value Ref Range   Cholesterol, Total 191 100 - 199 mg/dL   Triglycerides 177 (H) 0 - 149 mg/dL   HDL 44 >39 mg/dL   VLDL Cholesterol Cal 31 5 - 40 mg/dL   LDL Chol Calc (NIH) 116 (H) 0 - 99 mg/dL   Chol/HDL Ratio 4.3 0.0 - 5.0 ratio  Hepatic function panel  Result Value Ref Range   Total Protein 6.8 6.0 - 8.5 g/dL   Albumin 4.8 4.0 - 5.0 g/dL  Bilirubin Total 1.0 0.0 - 1.2 mg/dL   Bilirubin, Direct 0.22 0.00 - 0.40 mg/dL   Alkaline Phosphatase 58 48 - 121 IU/L   AST 18 0 - 40 IU/L   ALT 26 0 - 44 IU/L  CBC with Differential/Platelet  Result Value Ref Range   WBC 5.4 3.4 - 10.8 x10E3/uL   RBC 4.36 4.14 - 5.80 x10E6/uL   Hemoglobin 14.3 13.0 - 17.7 g/dL   Hematocrit 41.6 37.5 - 51.0 %   MCV 95 79 - 97 fL   MCH 32.8 26.6 - 33.0 pg   MCHC 34.4 31 - 35 g/dL   RDW 12.3 11.6 - 15.4 %   Platelets 210 150 - 450 x10E3/uL   Neutrophils 72 Not Estab. %   Lymphs 12 Not Estab. %   Monocytes 11 Not Estab. %   Eos 4 Not Estab. %   Basos 1 Not Estab. %   Neutrophils Absolute 3.8 1 - 7 x10E3/uL   Lymphocytes Absolute 0.7 0 - 3 x10E3/uL   Monocytes Absolute 0.6 0 - 0 x10E3/uL   EOS (ABSOLUTE) 0.2 0.0 - 0.4 x10E3/uL   Basophils Absolute 0.1 0 - 0 x10E3/uL   Immature Granulocytes 0 Not Estab. %   Immature Grans (Abs) 0.0 0.0 - 0.1 x10E3/uL          Assessment & Plan:  1. Routine general medical  examination at a health care facility Adult wellness-complete.wellness physical was conducted today. Importance of diet and exercise were discussed in detail.  In addition to this a discussion regarding safety was also covered. We also reviewed over immunizations and gave recommendations regarding current immunization needed for age.  In addition to this additional areas were also touched on including: Preventative health exams needed:  Colonoscopy not indicated yet  Patient was advised yearly wellness exam   2. Subclinical hypothyroidism Recheck this again in 8 to 10 weeks once TSH gets to 10 will start medication - TSH - T4 - Thyroid peroxidase antibody  3. Essential hypertension, benign Blood pressure good control watch diet stay active  4. Need for vaccination Flu vaccine today - Flu Vaccine QUAD 6+ mos PF IM (Fluarix Quad PF)

## 2020-05-15 NOTE — Patient Instructions (Signed)
DASH Eating Plan DASH stands for "Dietary Approaches to Stop Hypertension." The DASH eating plan is a healthy eating plan that has been shown to reduce high blood pressure (hypertension). It may also reduce your risk for type 2 diabetes, heart disease, and stroke. The DASH eating plan may also help with weight loss. What are tips for following this plan?  General guidelines  Avoid eating more than 2,300 mg (milligrams) of salt (sodium) a day. If you have hypertension, you may need to reduce your sodium intake to 1,500 mg a day.  Limit alcohol intake to no more than 1 drink a day for nonpregnant women and 2 drinks a day for men. One drink equals 12 oz of beer, 5 oz of wine, or 1 oz of hard liquor.  Work with your health care provider to maintain a healthy body weight or to lose weight. Ask what an ideal weight is for you.  Get at least 30 minutes of exercise that causes your heart to beat faster (aerobic exercise) most days of the week. Activities may include walking, swimming, or biking.  Work with your health care provider or diet and nutrition specialist (dietitian) to adjust your eating plan to your individual calorie needs. Reading food labels   Check food labels for the amount of sodium per serving. Choose foods with less than 5 percent of the Daily Value of sodium. Generally, foods with less than 300 mg of sodium per serving fit into this eating plan.  To find whole grains, look for the word "whole" as the first word in the ingredient list. Shopping  Buy products labeled as "low-sodium" or "no salt added."  Buy fresh foods. Avoid canned foods and premade or frozen meals. Cooking  Avoid adding salt when cooking. Use salt-free seasonings or herbs instead of table salt or sea salt. Check with your health care provider or pharmacist before using salt substitutes.  Do not fry foods. Cook foods using healthy methods such as baking, boiling, grilling, and broiling instead.  Cook with  heart-healthy oils, such as olive, canola, soybean, or sunflower oil. Meal planning  Eat a balanced diet that includes: ? 5 or more servings of fruits and vegetables each day. At each meal, try to fill half of your plate with fruits and vegetables. ? Up to 6-8 servings of whole grains each day. ? Less than 6 oz of lean meat, poultry, or fish each day. A 3-oz serving of meat is about the same size as a deck of cards. One egg equals 1 oz. ? 2 servings of low-fat dairy each day. ? A serving of nuts, seeds, or beans 5 times each week. ? Heart-healthy fats. Healthy fats called Omega-3 fatty acids are found in foods such as flaxseeds and coldwater fish, like sardines, salmon, and mackerel.  Limit how much you eat of the following: ? Canned or prepackaged foods. ? Food that is high in trans fat, such as fried foods. ? Food that is high in saturated fat, such as fatty meat. ? Sweets, desserts, sugary drinks, and other foods with added sugar. ? Full-fat dairy products.  Do not salt foods before eating.  Try to eat at least 2 vegetarian meals each week.  Eat more home-cooked food and less restaurant, buffet, and fast food.  When eating at a restaurant, ask that your food be prepared with less salt or no salt, if possible. What foods are recommended? The items listed may not be a complete list. Talk with your dietitian about   what dietary choices are best for you. Grains Whole-grain or whole-wheat bread. Whole-grain or whole-wheat pasta. Brown rice. Oatmeal. Quinoa. Bulgur. Whole-grain and low-sodium cereals. Pita bread. Low-fat, low-sodium crackers. Whole-wheat flour tortillas. Vegetables Fresh or frozen vegetables (raw, steamed, roasted, or grilled). Low-sodium or reduced-sodium tomato and vegetable juice. Low-sodium or reduced-sodium tomato sauce and tomato paste. Low-sodium or reduced-sodium canned vegetables. Fruits All fresh, dried, or frozen fruit. Canned fruit in natural juice (without  added sugar). Meat and other protein foods Skinless chicken or turkey. Ground chicken or turkey. Pork with fat trimmed off. Fish and seafood. Egg whites. Dried beans, peas, or lentils. Unsalted nuts, nut butters, and seeds. Unsalted canned beans. Lean cuts of beef with fat trimmed off. Low-sodium, lean deli meat. Dairy Low-fat (1%) or fat-free (skim) milk. Fat-free, low-fat, or reduced-fat cheeses. Nonfat, low-sodium ricotta or cottage cheese. Low-fat or nonfat yogurt. Low-fat, low-sodium cheese. Fats and oils Soft margarine without trans fats. Vegetable oil. Low-fat, reduced-fat, or light mayonnaise and salad dressings (reduced-sodium). Canola, safflower, olive, soybean, and sunflower oils. Avocado. Seasoning and other foods Herbs. Spices. Seasoning mixes without salt. Unsalted popcorn and pretzels. Fat-free sweets. What foods are not recommended? The items listed may not be a complete list. Talk with your dietitian about what dietary choices are best for you. Grains Baked goods made with fat, such as croissants, muffins, or some breads. Dry pasta or rice meal packs. Vegetables Creamed or fried vegetables. Vegetables in a cheese sauce. Regular canned vegetables (not low-sodium or reduced-sodium). Regular canned tomato sauce and paste (not low-sodium or reduced-sodium). Regular tomato and vegetable juice (not low-sodium or reduced-sodium). Pickles. Olives. Fruits Canned fruit in a light or heavy syrup. Fried fruit. Fruit in cream or butter sauce. Meat and other protein foods Fatty cuts of meat. Ribs. Fried meat. Bacon. Sausage. Bologna and other processed lunch meats. Salami. Fatback. Hotdogs. Bratwurst. Salted nuts and seeds. Canned beans with added salt. Canned or smoked fish. Whole eggs or egg yolks. Chicken or turkey with skin. Dairy Whole or 2% milk, cream, and half-and-half. Whole or full-fat cream cheese. Whole-fat or sweetened yogurt. Full-fat cheese. Nondairy creamers. Whipped toppings.  Processed cheese and cheese spreads. Fats and oils Butter. Stick margarine. Lard. Shortening. Ghee. Bacon fat. Tropical oils, such as coconut, palm kernel, or palm oil. Seasoning and other foods Salted popcorn and pretzels. Onion salt, garlic salt, seasoned salt, table salt, and sea salt. Worcestershire sauce. Tartar sauce. Barbecue sauce. Teriyaki sauce. Soy sauce, including reduced-sodium. Steak sauce. Canned and packaged gravies. Fish sauce. Oyster sauce. Cocktail sauce. Horseradish that you find on the shelf. Ketchup. Mustard. Meat flavorings and tenderizers. Bouillon cubes. Hot sauce and Tabasco sauce. Premade or packaged marinades. Premade or packaged taco seasonings. Relishes. Regular salad dressings. Where to find more information:  National Heart, Lung, and Blood Institute: www.nhlbi.nih.gov  American Heart Association: www.heart.org Summary  The DASH eating plan is a healthy eating plan that has been shown to reduce high blood pressure (hypertension). It may also reduce your risk for type 2 diabetes, heart disease, and stroke.  With the DASH eating plan, you should limit salt (sodium) intake to 2,300 mg a day. If you have hypertension, you may need to reduce your sodium intake to 1,500 mg a day.  When on the DASH eating plan, aim to eat more fresh fruits and vegetables, whole grains, lean proteins, low-fat dairy, and heart-healthy fats.  Work with your health care provider or diet and nutrition specialist (dietitian) to adjust your eating plan to your   individual calorie needs. This information is not intended to replace advice given to you by your health care provider. Make sure you discuss any questions you have with your health care provider. Document Revised: 08/06/2017 Document Reviewed: 08/17/2016 Elsevier Patient Education  2020 [employer]  

## 2020-05-21 ENCOUNTER — Other Ambulatory Visit: Payer: Self-pay | Admitting: Family Medicine

## 2020-08-05 DIAGNOSIS — C029 Malignant neoplasm of tongue, unspecified: Secondary | ICD-10-CM | POA: Diagnosis not present

## 2020-09-22 ENCOUNTER — Other Ambulatory Visit: Payer: Self-pay | Admitting: Family Medicine

## 2020-10-02 DIAGNOSIS — E039 Hypothyroidism, unspecified: Secondary | ICD-10-CM | POA: Diagnosis not present

## 2020-10-03 LAB — T4: T4, Total: 5 ug/dL (ref 4.5–12.0)

## 2020-10-03 LAB — TSH: TSH: 9.02 u[IU]/mL — ABNORMAL HIGH (ref 0.450–4.500)

## 2020-10-03 LAB — THYROID PEROXIDASE ANTIBODY: Thyroperoxidase Ab SerPl-aCnc: <8 IU/mL (ref 0–34)

## 2020-10-09 ENCOUNTER — Encounter: Payer: Self-pay | Admitting: Family Medicine

## 2020-10-09 ENCOUNTER — Telehealth (INDEPENDENT_AMBULATORY_CARE_PROVIDER_SITE_OTHER): Payer: BC Managed Care – PPO | Admitting: Family Medicine

## 2020-10-09 ENCOUNTER — Telehealth: Payer: Self-pay | Admitting: Family Medicine

## 2020-10-09 ENCOUNTER — Other Ambulatory Visit: Payer: Self-pay

## 2020-10-09 DIAGNOSIS — E039 Hypothyroidism, unspecified: Secondary | ICD-10-CM

## 2020-10-09 HISTORY — DX: Hypothyroidism, unspecified: E03.9

## 2020-10-09 MED ORDER — LEVOTHYROXINE SODIUM 25 MCG PO TABS: 25.0000 ug | ORAL_TABLET | Freq: Every day | ORAL | 2 refills | Status: DC

## 2020-10-09 NOTE — Progress Notes (Signed)
   Subjective:    Patient ID: Christopher Buckley, male    DOB: 1970-10-17, 50 y.o.   MRN: [id number]  HPI Pt needing visit to discuss starting thyroid meds. Pt has labs done recently.  Patient had previous radiation therapy for oral cancer and because of this has gradually seen TSH go up and T4 go down he relates feeling somewhat tired sluggish at times weight staying steady denies dry skin constipation labs reviewed with patient Virtual Visit via Telephone Note  I connected with Christopher Buckley on 10/09/20 at 11:00 AM EST by telephone and verified that I am speaking with the correct person using two identifiers.  Location: Patient: home Provider: office   I discussed the limitations, risks, security and privacy concerns of performing an evaluation and management service by telephone and the availability of in person appointments. I also discussed with the patient that there may be a patient responsible charge related to this service. The patient expressed understanding and agreed to proceed.   History of Present Illness:    Observations/Objective:   Assessment and Plan:   Follow Up Instructions:    I discussed the assessment and treatment plan with the patient. The patient was provided an opportunity to ask questions and all were answered. The patient agreed with the plan and demonstrated an understanding of the instructions.   The patient was advised to call back or seek an in-person evaluation if the symptoms worsen or if the condition fails to improve as anticipated.  I provided 20 chart review time spent with patient documentation minutes of non-face-to-face time during this encounter.       Review of Systems Please see above    Objective:   Physical Exam  Today's visit was via telephone Physical exam was not possible for this visit       Assessment & Plan:  Hypothyroidism We did discuss how we could recheck this again in 6 months or go ahead and initiate  low-dose Because of patient having some sluggishness he is opted to go ahead and start medication which I agree with Recheck labs again in 8 to 12 weeks Levothyroxine 25 mcg 1 each morning proper way to take it was discussed

## 2020-10-09 NOTE — Addendum Note (Signed)
Addended by: Vicente Males on: 10/09/2020 04:21 PM   Modules accepted: Orders

## 2020-10-09 NOTE — Telephone Encounter (Signed)
Mr. Christopher Buckley, Christopher Buckley are scheduled for a virtual visit with your provider today.    Just as we do with appointments in the office, we must obtain your consent to participate.  Your consent will be active for this visit and any virtual visit you may have with one of our providers in the next 365 days.    If you have a MyChart account, I can also send a copy of this consent to you electronically.  All virtual visits are billed to your insurance company just like a traditional visit in the office.  As this is a virtual visit, video technology does not allow for your provider to perform a traditional examination.  This may limit your provider's ability to fully assess your condition.  If your provider identifies any concerns that need to be evaluated in person or the need to arrange testing such as labs, EKG, etc, we will make arrangements to do so.    Although advances in technology are sophisticated, we cannot ensure that it will always work on either your end or our end.  If the connection with a video visit is poor, we may have to switch to a telephone visit.  With either a video or telephone visit, we are not always able to ensure that we have a secure connection.   I need to obtain your verbal consent now.   Are you willing to proceed with your visit today?   Christopher Buckley has provided verbal consent on 10/09/2020 for a virtual visit (video or telephone).   Vicente Males, LPN 01/12/176  93:90 AM

## 2020-10-09 NOTE — Progress Notes (Signed)
10/09/20-lab orders placed

## 2020-10-27 ENCOUNTER — Other Ambulatory Visit: Payer: Self-pay | Admitting: Family Medicine

## 2020-11-12 ENCOUNTER — Ambulatory Visit: Payer: BC Managed Care – PPO | Admitting: Family Medicine

## 2020-11-15 DIAGNOSIS — C029 Malignant neoplasm of tongue, unspecified: Secondary | ICD-10-CM | POA: Diagnosis not present

## 2020-11-15 DIAGNOSIS — Z9221 Personal history of antineoplastic chemotherapy: Secondary | ICD-10-CM | POA: Diagnosis not present

## 2020-11-15 DIAGNOSIS — Z923 Personal history of irradiation: Secondary | ICD-10-CM | POA: Diagnosis not present

## 2021-01-07 ENCOUNTER — Other Ambulatory Visit: Payer: Self-pay | Admitting: Family Medicine

## 2021-02-19 ENCOUNTER — Telehealth: Payer: Self-pay

## 2021-02-19 DIAGNOSIS — I10 Essential (primary) hypertension: Secondary | ICD-10-CM

## 2021-02-19 DIAGNOSIS — C029 Malignant neoplasm of tongue, unspecified: Secondary | ICD-10-CM | POA: Diagnosis not present

## 2021-02-19 DIAGNOSIS — Z0000 Encounter for general adult medical examination without abnormal findings: Secondary | ICD-10-CM

## 2021-02-19 DIAGNOSIS — Z08 Encounter for follow-up examination after completed treatment for malignant neoplasm: Secondary | ICD-10-CM | POA: Diagnosis not present

## 2021-02-19 DIAGNOSIS — Z85810 Personal history of malignant neoplasm of tongue: Secondary | ICD-10-CM | POA: Diagnosis not present

## 2021-02-19 DIAGNOSIS — Z9889 Other specified postprocedural states: Secondary | ICD-10-CM | POA: Diagnosis not present

## 2021-02-19 DIAGNOSIS — E039 Hypothyroidism, unspecified: Secondary | ICD-10-CM

## 2021-02-19 DIAGNOSIS — Z125 Encounter for screening for malignant neoplasm of prostate: Secondary | ICD-10-CM

## 2021-02-19 DIAGNOSIS — E785 Hyperlipidemia, unspecified: Secondary | ICD-10-CM

## 2021-02-19 NOTE — Telephone Encounter (Signed)
Having physical in July, want s lab orders put in for [facility], wants full "biometric" screening.

## 2021-02-19 NOTE — Telephone Encounter (Signed)
wants full "biometric" screening. Last labs 10/02/20 tsh, t4 and thyroid peroxidase

## 2021-02-19 NOTE — Telephone Encounter (Signed)
TSH, free T4, lipid, CMP, CBC, PSA-hypertension hyperlipidemia hypothyroidism history of oral cancer, screening wellness

## 2021-02-20 NOTE — Telephone Encounter (Signed)
Bw orders put in and pt was notified.

## 2021-03-24 ENCOUNTER — Other Ambulatory Visit: Payer: Self-pay | Admitting: Family Medicine

## 2021-03-26 DIAGNOSIS — E039 Hypothyroidism, unspecified: Secondary | ICD-10-CM | POA: Diagnosis not present

## 2021-03-26 DIAGNOSIS — Z125 Encounter for screening for malignant neoplasm of prostate: Secondary | ICD-10-CM | POA: Diagnosis not present

## 2021-03-26 DIAGNOSIS — Z0000 Encounter for general adult medical examination without abnormal findings: Secondary | ICD-10-CM | POA: Diagnosis not present

## 2021-03-26 DIAGNOSIS — E785 Hyperlipidemia, unspecified: Secondary | ICD-10-CM | POA: Diagnosis not present

## 2021-03-26 DIAGNOSIS — I10 Essential (primary) hypertension: Secondary | ICD-10-CM | POA: Diagnosis not present

## 2021-03-27 LAB — CBC WITH DIFFERENTIAL/PLATELET
Basophils Absolute: 0 10*3/uL (ref 0.0–0.2)
Basos: 1 %
EOS (ABSOLUTE): 0.3 10*3/uL (ref 0.0–0.4)
Eos: 8 %
Hematocrit: 41.7 % (ref 37.5–51.0)
Hemoglobin: 14.7 g/dL (ref 13.0–17.7)
Immature Grans (Abs): 0 10*3/uL (ref 0.0–0.1)
Immature Granulocytes: 1 %
Lymphocytes Absolute: 0.7 10*3/uL (ref 0.7–3.1)
Lymphs: 17 %
MCH: 33.5 pg — ABNORMAL HIGH (ref 26.6–33.0)
MCHC: 35.3 g/dL (ref 31.5–35.7)
MCV: 95 fL (ref 79–97)
Monocytes Absolute: 0.5 10*3/uL (ref 0.1–0.9)
Monocytes: 13 %
Neutrophils Absolute: 2.4 10*3/uL (ref 1.4–7.0)
Neutrophils: 60 %
Platelets: 205 10*3/uL (ref 150–450)
RBC: 4.39 x10E6/uL (ref 4.14–5.80)
RDW: 12.7 % (ref 11.6–15.4)
WBC: 4 10*3/uL (ref 3.4–10.8)

## 2021-03-27 LAB — COMPREHENSIVE METABOLIC PANEL
ALT: 27 IU/L (ref 0–44)
AST: 16 IU/L (ref 0–40)
Albumin/Globulin Ratio: 2.2 (ref 1.2–2.2)
Albumin: 4.6 g/dL (ref 4.0–5.0)
Alkaline Phosphatase: 55 IU/L (ref 44–121)
BUN/Creatinine Ratio: 12 (ref 9–20)
BUN: 11 mg/dL (ref 6–24)
Bilirubin Total: 1.2 mg/dL (ref 0.0–1.2)
CO2: 24 mmol/L (ref 20–29)
Calcium: 9.2 mg/dL (ref 8.7–10.2)
Chloride: 99 mmol/L (ref 96–106)
Creatinine, Ser: 0.94 mg/dL (ref 0.76–1.27)
Globulin, Total: 2.1 g/dL (ref 1.5–4.5)
Glucose: 117 mg/dL — ABNORMAL HIGH (ref 65–99)
Potassium: 4.3 mmol/L (ref 3.5–5.2)
Sodium: 137 mmol/L (ref 134–144)
Total Protein: 6.7 g/dL (ref 6.0–8.5)
eGFR: 99 mL/min/{1.73_m2} (ref >59)

## 2021-03-27 LAB — T4, FREE: Free T4: 1.01 ng/dL (ref 0.82–1.77)

## 2021-03-27 LAB — LIPID PANEL
Chol/HDL Ratio: 4.2 ratio (ref 0.0–5.0)
Cholesterol, Total: 199 mg/dL (ref 100–199)
HDL: 47 mg/dL (ref >39)
LDL Chol Calc (NIH): 126 mg/dL — ABNORMAL HIGH (ref 0–99)
Triglycerides: 148 mg/dL (ref 0–149)
VLDL Cholesterol Cal: 26 mg/dL (ref 5–40)

## 2021-03-27 LAB — PSA: Prostate Specific Ag, Serum: 0.5 ng/mL (ref 0.0–4.0)

## 2021-03-27 LAB — TSH: TSH: 8.31 u[IU]/mL — ABNORMAL HIGH (ref 0.450–4.500)

## 2021-04-02 ENCOUNTER — Encounter: Payer: Self-pay | Admitting: Family Medicine

## 2021-04-03 ENCOUNTER — Encounter: Payer: Self-pay | Admitting: Family Medicine

## 2021-04-03 ENCOUNTER — Other Ambulatory Visit: Payer: Self-pay

## 2021-04-03 ENCOUNTER — Ambulatory Visit (INDEPENDENT_AMBULATORY_CARE_PROVIDER_SITE_OTHER): Payer: BC Managed Care – PPO | Admitting: Family Medicine

## 2021-04-03 VITALS — BP 124/86 | HR 72 | Temp 97.5°F | Ht 77.0 in | Wt 245.0 lb

## 2021-04-03 DIAGNOSIS — E039 Hypothyroidism, unspecified: Secondary | ICD-10-CM | POA: Diagnosis not present

## 2021-04-03 DIAGNOSIS — Z1211 Encounter for screening for malignant neoplasm of colon: Secondary | ICD-10-CM

## 2021-04-03 DIAGNOSIS — Z0001 Encounter for general adult medical examination with abnormal findings: Secondary | ICD-10-CM

## 2021-04-03 DIAGNOSIS — R7301 Impaired fasting glucose: Secondary | ICD-10-CM | POA: Diagnosis not present

## 2021-04-03 DIAGNOSIS — E785 Hyperlipidemia, unspecified: Secondary | ICD-10-CM

## 2021-04-03 DIAGNOSIS — Z0000 Encounter for general adult medical examination without abnormal findings: Secondary | ICD-10-CM

## 2021-04-03 MED ORDER — LEVOTHYROXINE SODIUM 50 MCG PO TABS: 50.0000 ug | ORAL_TABLET | Freq: Every day | ORAL | 1 refills | Status: DC

## 2021-04-03 MED ORDER — LISINOPRIL-HYDROCHLOROTHIAZIDE 20-25 MG PO TABS: 1.0000 | ORAL_TABLET | Freq: Every day | ORAL | 0 refills | Status: DC

## 2021-04-03 MED ORDER — AMLODIPINE BESYLATE 5 MG PO TABS: 5.0000 mg | ORAL_TABLET | Freq: Every day | ORAL | 1 refills | Status: DC

## 2021-04-03 NOTE — Progress Notes (Signed)
   Subjective:    Patient ID: Christopher Buckley, male    DOB: [birth year], 50 y.o.   MRN: [id number]  HPI The patient comes in today for a wellness visit.    A review of their health history was completed.  A review of medications was also completed.  Any needed refills; amlodipine, zestoric, levothyroxine  Eating habits: good  Falls/  MVA accidents in past few months: no  Regular exercise: when work permits  Specialist pt sees on regular basis: otolaryngologist, oncologist  Preventative health issues were discussed.   Additional concerns:    Review of Systems     Objective:   Physical Exam  General-in no acute distress Eyes-no discharge Lungs-respiratory rate normal, CTA CV-no murmurs,RRR Extremities skin warm dry no edema Neuro grossly normal Behavior normal, alert Prostate nl Head and neck exam good no sign of cancer     Assessment & Plan:  1. Well adult exam ed. Adult wellness-complete.wellness physical was conducted today. Importance of diet and exercise were discussed in detail.  In addition to this a discussion regarding safety was also covered. We also reviewed over immunizations and gave recommendations regarding current immunization needed for age.  In addition to this additional areas were also touched on including: Preventative health exams needed:  Colonoscopy referral given  Patient was advised yearly wellness exam  - Ambulatory referral to Gastroenterology  2. Fasting hyperglycemia Watch diet closely minimize starches stay physically active  3. Hyperlipidemia, unspecified hyperlipidemia type Mild elevation watch diet closely  4. Hypothyroidism (acquired) Bump up the dose of the thyroid medicine  5. Screening for colon cancer Referral - Ambulatory referral to Gastroenterology  Repeat lipid, TSH, T4, glucose, A1c in 3 to 4 months

## 2021-04-03 NOTE — Patient Instructions (Addendum)
Please do your blood work between the end of September and the end of October  Follow-up in 6 months  If any health problems please let me know thanks-Dr. Nicki Reaper

## 2021-04-07 NOTE — Addendum Note (Signed)
Addended by: Vicente Males on: 04/07/2021 11:31 AM   Modules accepted: Orders

## 2021-04-07 NOTE — Progress Notes (Signed)
04/07/21-lab orders placed and mailed to patient. My chart message also sent to patient.

## 2021-04-08 ENCOUNTER — Encounter (INDEPENDENT_AMBULATORY_CARE_PROVIDER_SITE_OTHER): Payer: Self-pay | Admitting: *Deleted

## 2021-04-16 DIAGNOSIS — R1310 Dysphagia, unspecified: Secondary | ICD-10-CM | POA: Diagnosis not present

## 2021-04-16 DIAGNOSIS — R633 Feeding difficulties, unspecified: Secondary | ICD-10-CM | POA: Diagnosis not present

## 2021-06-18 ENCOUNTER — Other Ambulatory Visit: Payer: Self-pay | Admitting: Family Medicine

## 2021-06-29 ENCOUNTER — Other Ambulatory Visit: Payer: Self-pay | Admitting: Family Medicine

## 2021-06-30 DIAGNOSIS — E039 Hypothyroidism, unspecified: Secondary | ICD-10-CM | POA: Diagnosis not present

## 2021-07-01 ENCOUNTER — Other Ambulatory Visit: Payer: Self-pay

## 2021-07-01 DIAGNOSIS — E039 Hypothyroidism, unspecified: Secondary | ICD-10-CM

## 2021-07-01 LAB — TSH: TSH: 6.04 u[IU]/mL — ABNORMAL HIGH (ref 0.450–4.500)

## 2021-07-01 LAB — T4, FREE: Free T4: 1.05 ng/dL (ref 0.82–1.77)

## 2021-07-01 MED ORDER — LEVOTHYROXINE SODIUM 75 MCG PO TABS: 75.0000 ug | ORAL_TABLET | Freq: Every day | ORAL | 1 refills | Status: DC

## 2021-07-06 ENCOUNTER — Other Ambulatory Visit: Payer: Self-pay | Admitting: Family Medicine

## 2021-08-08 DIAGNOSIS — C029 Malignant neoplasm of tongue, unspecified: Secondary | ICD-10-CM | POA: Diagnosis not present

## 2021-08-08 DIAGNOSIS — Z9889 Other specified postprocedural states: Secondary | ICD-10-CM | POA: Diagnosis not present

## 2021-08-08 DIAGNOSIS — Z923 Personal history of irradiation: Secondary | ICD-10-CM | POA: Diagnosis not present

## 2021-08-08 DIAGNOSIS — Z9221 Personal history of antineoplastic chemotherapy: Secondary | ICD-10-CM | POA: Diagnosis not present

## 2021-09-29 ENCOUNTER — Ambulatory Visit (INDEPENDENT_AMBULATORY_CARE_PROVIDER_SITE_OTHER): Payer: BLUE CROSS/BLUE SHIELD | Admitting: Family Medicine

## 2021-09-29 ENCOUNTER — Other Ambulatory Visit: Payer: Self-pay

## 2021-09-29 VITALS — BP 128/86 | HR 90 | Temp 99.5°F | Wt 250.0 lb

## 2021-09-29 DIAGNOSIS — E039 Hypothyroidism, unspecified: Secondary | ICD-10-CM

## 2021-09-29 DIAGNOSIS — R7301 Impaired fasting glucose: Secondary | ICD-10-CM

## 2021-09-29 DIAGNOSIS — I10 Essential (primary) hypertension: Secondary | ICD-10-CM | POA: Diagnosis not present

## 2021-09-29 DIAGNOSIS — E782 Mixed hyperlipidemia: Secondary | ICD-10-CM | POA: Diagnosis not present

## 2021-09-29 DIAGNOSIS — J019 Acute sinusitis, unspecified: Secondary | ICD-10-CM

## 2021-09-29 DIAGNOSIS — B9689 Other specified bacterial agents as the cause of diseases classified elsewhere: Secondary | ICD-10-CM

## 2021-09-29 MED ORDER — AMOXICILLIN-POT CLAVULANATE 875-125 MG PO TABS: 1.0000 | ORAL_TABLET | Freq: Two times a day (BID) | ORAL | 0 refills | Status: DC

## 2021-09-29 NOTE — Progress Notes (Signed)
° °  Subjective:    Patient ID: Christopher Buckley, male    DOB: 1971/07/27, 51 y.o.   MRN: [id number]  HPI Patient says head and left ear stopped up. Coughing up yellow/green mucus and having sinus pressure. Started feeling bad on Tuesday night (09/23/21), was taking mucinex and felt a little better. Significant sinus congestion drainage sinus pressure not feeling good denies wheezing difficulty breathing  Review of Systems     Objective:   Physical Exam  General-in no acute distress Eyes-no discharge Lungs-respiratory rate normal, CTA CV-no murmurs,RRR Extremities skin warm dry no edema Neuro grossly normal Behavior normal, alert       Assessment & Plan:  Sinusitis antibiotic prescribed warning signs discussed follow-up if progressive troubles doubt COVID symptoms been going on for over a week and getting better except for the sinus since  Lab work ordered for other associated illnesses that are stable currently patient to follow-up by summertime Essential hypertension, benign - Plan: Basic Metabolic Panel (BMET)  Hypothyroidism (acquired) - Plan: TSH + free T4, Basic Metabolic Panel (BMET)  Mixed hyperlipidemia - Plan: Lipid Profile, Basic Metabolic Panel (BMET)  Fasting hyperglycemia - Plan: HgB J6E, Basic Metabolic Panel (BMET)  Acute bacterial rhinosinusitis - Plan: Basic Metabolic Panel (BMET) Because of previous fasting hyperglycemia check A1c Also make sure lipid profile kidney function look good

## 2021-10-05 ENCOUNTER — Other Ambulatory Visit: Payer: Self-pay | Admitting: Family Medicine

## 2021-10-09 ENCOUNTER — Ambulatory Visit: Payer: BC Managed Care – PPO | Admitting: Family Medicine

## 2021-11-30 ENCOUNTER — Other Ambulatory Visit: Payer: Self-pay | Admitting: Family Medicine

## 2021-12-24 ENCOUNTER — Other Ambulatory Visit: Payer: Self-pay | Admitting: Family Medicine

## 2021-12-24 DIAGNOSIS — E039 Hypothyroidism, unspecified: Secondary | ICD-10-CM

## 2022-01-04 ENCOUNTER — Other Ambulatory Visit: Payer: Self-pay | Admitting: Family Medicine

## 2022-01-28 DIAGNOSIS — Z08 Encounter for follow-up examination after completed treatment for malignant neoplasm: Secondary | ICD-10-CM | POA: Diagnosis not present

## 2022-01-28 DIAGNOSIS — Z923 Personal history of irradiation: Secondary | ICD-10-CM | POA: Diagnosis not present

## 2022-01-28 DIAGNOSIS — Z85810 Personal history of malignant neoplasm of tongue: Secondary | ICD-10-CM | POA: Diagnosis not present

## 2022-01-28 DIAGNOSIS — J384 Edema of larynx: Secondary | ICD-10-CM | POA: Diagnosis not present

## 2022-01-28 DIAGNOSIS — Z9221 Personal history of antineoplastic chemotherapy: Secondary | ICD-10-CM | POA: Diagnosis not present

## 2022-02-10 ENCOUNTER — Other Ambulatory Visit: Payer: Self-pay | Admitting: Family Medicine

## 2022-03-05 ENCOUNTER — Other Ambulatory Visit: Payer: Self-pay | Admitting: Family Medicine

## 2022-03-05 DIAGNOSIS — E039 Hypothyroidism, unspecified: Secondary | ICD-10-CM

## 2022-03-05 DIAGNOSIS — E782 Mixed hyperlipidemia: Secondary | ICD-10-CM

## 2022-03-05 DIAGNOSIS — Z79899 Other long term (current) drug therapy: Secondary | ICD-10-CM

## 2022-03-05 DIAGNOSIS — Z0000 Encounter for general adult medical examination without abnormal findings: Secondary | ICD-10-CM

## 2022-03-05 DIAGNOSIS — I10 Essential (primary) hypertension: Secondary | ICD-10-CM

## 2022-03-05 DIAGNOSIS — Z125 Encounter for screening for malignant neoplasm of prostate: Secondary | ICD-10-CM

## 2022-03-05 DIAGNOSIS — R7301 Impaired fasting glucose: Secondary | ICD-10-CM

## 2022-03-12 DIAGNOSIS — Z79899 Other long term (current) drug therapy: Secondary | ICD-10-CM | POA: Diagnosis not present

## 2022-03-12 DIAGNOSIS — R7301 Impaired fasting glucose: Secondary | ICD-10-CM | POA: Diagnosis not present

## 2022-03-12 DIAGNOSIS — I10 Essential (primary) hypertension: Secondary | ICD-10-CM | POA: Diagnosis not present

## 2022-03-12 DIAGNOSIS — E782 Mixed hyperlipidemia: Secondary | ICD-10-CM | POA: Diagnosis not present

## 2022-03-12 DIAGNOSIS — Z125 Encounter for screening for malignant neoplasm of prostate: Secondary | ICD-10-CM | POA: Diagnosis not present

## 2022-03-12 DIAGNOSIS — E039 Hypothyroidism, unspecified: Secondary | ICD-10-CM | POA: Diagnosis not present

## 2022-03-13 LAB — HEPATIC FUNCTION PANEL
ALT: 30 IU/L (ref 0–44)
AST: 19 IU/L (ref 0–40)
Albumin: 4.5 g/dL (ref 4.0–5.0)
Alkaline Phosphatase: 60 IU/L (ref 44–121)
Bilirubin Total: 1.3 mg/dL — ABNORMAL HIGH (ref 0.0–1.2)
Bilirubin, Direct: 0.27 mg/dL (ref 0.00–0.40)
Total Protein: 6.8 g/dL (ref 6.0–8.5)

## 2022-03-13 LAB — BASIC METABOLIC PANEL
BUN/Creatinine Ratio: 13 (ref 9–20)
BUN: 12 mg/dL (ref 6–24)
CO2: 23 mmol/L (ref 20–29)
Calcium: 9.6 mg/dL (ref 8.7–10.2)
Chloride: 98 mmol/L (ref 96–106)
Creatinine, Ser: 0.95 mg/dL (ref 0.76–1.27)
Glucose: 118 mg/dL — ABNORMAL HIGH (ref 70–99)
Potassium: 4.1 mmol/L (ref 3.5–5.2)
Sodium: 136 mmol/L (ref 134–144)
eGFR: 98 mL/min/{1.73_m2} (ref >59)

## 2022-03-13 LAB — HEMOGLOBIN A1C
Est. average glucose Bld gHb Est-mCnc: 114 mg/dL
Hgb A1c MFr Bld: 5.6 % (ref 4.8–5.6)

## 2022-03-13 LAB — LIPID PANEL
Chol/HDL Ratio: 4.2 ratio (ref 0.0–5.0)
Cholesterol, Total: 193 mg/dL (ref 100–199)
HDL: 46 mg/dL (ref >39)
LDL Chol Calc (NIH): 117 mg/dL — ABNORMAL HIGH (ref 0–99)
Triglycerides: 172 mg/dL — ABNORMAL HIGH (ref 0–149)
VLDL Cholesterol Cal: 30 mg/dL (ref 5–40)

## 2022-03-13 LAB — TSH: TSH: 5.9 u[IU]/mL — ABNORMAL HIGH (ref 0.450–4.500)

## 2022-03-13 LAB — T4, FREE: Free T4: 1.25 ng/dL (ref 0.82–1.77)

## 2022-03-13 LAB — PSA: Prostate Specific Ag, Serum: 2.3 ng/mL (ref 0.0–4.0)

## 2022-04-01 ENCOUNTER — Ambulatory Visit (INDEPENDENT_AMBULATORY_CARE_PROVIDER_SITE_OTHER): Payer: BC Managed Care – PPO | Admitting: Family Medicine

## 2022-04-01 ENCOUNTER — Encounter: Payer: Self-pay | Admitting: Family Medicine

## 2022-04-01 VITALS — BP 126/76 | HR 75 | Temp 98.6°F | Ht 77.0 in | Wt 254.0 lb

## 2022-04-01 DIAGNOSIS — Z0000 Encounter for general adult medical examination without abnormal findings: Secondary | ICD-10-CM | POA: Diagnosis not present

## 2022-04-01 DIAGNOSIS — E782 Mixed hyperlipidemia: Secondary | ICD-10-CM | POA: Diagnosis not present

## 2022-04-01 DIAGNOSIS — E039 Hypothyroidism, unspecified: Secondary | ICD-10-CM | POA: Diagnosis not present

## 2022-04-01 DIAGNOSIS — I10 Essential (primary) hypertension: Secondary | ICD-10-CM | POA: Diagnosis not present

## 2022-04-01 MED ORDER — AMLODIPINE BESYLATE 5 MG PO TABS: 5.0000 mg | ORAL_TABLET | Freq: Every day | ORAL | 1 refills | Status: DC

## 2022-04-01 MED ORDER — LEVOTHYROXINE SODIUM 88 MCG PO TABS: 88.0000 ug | ORAL_TABLET | Freq: Every day | ORAL | 3 refills | Status: DC

## 2022-04-01 NOTE — Patient Instructions (Signed)

## 2022-04-01 NOTE — Progress Notes (Signed)
   Subjective:    Patient ID: Christopher Buckley, male    DOB: Feb 10, 1971, 51 y.o.   MRN: [id number]  HPI The patient comes in today for a wellness visit.  Very nice patient No longer smokes Does have a cancer history of the tongue head and neck Does see his ENT specialist and the radiation specialist every 6 months alternating He states he has been doing well recently trying to be healthy with eating and trying to fit in regular activity with work His son plays [employer]  A review of their health history was completed.  A review of medications was also completed.  Any needed refills; all 90 days  Eating habits: good  Falls/  MVA accidents in past few months: no  Regular exercise: no  Specialist pt sees on regular basis: oncology Dr Vicie Mutters, radiation- Dr Adella Nissen  Preventative health issues were discussed.   Additional concerns:  The 10-year ASCVD risk score (Arnett DK, et al., 2019) is: 3.8%   Values used to calculate the score:     Age: 67 years     Sex: Male     Is Non-Hispanic African American: No     Diabetic: No     Tobacco smoker: No     Systolic Blood Pressure: 390 mmHg     Is BP treated: Yes     HDL Cholesterol: 46 mg/dL     Total Cholesterol: 193 mg/dL  We did review over his labs Review of Systems     Objective:   Physical Exam General-in no acute distress Eyes-no discharge Lungs-respiratory rate normal, CTA CV-no murmurs,RRR Extremities skin warm dry no edema Neuro grossly normal Behavior normal, alert Prostate normal       Assessment & Plan:  1. Hypothyroidism (acquired) TSH slightly elevated bump up dose to 88 mcg repeat lab work by 6 months from now  2. Essential hypertension, benign Blood pressure good control healthy diet recommended continue medication minimize salt stay physically active stay away from smoking  3. Mixed hyperlipidemia Continue healthy diet.  His total risk is 3.8% statin not indicated currently  4. Well adult  exam Dietary measures discussed Safety discussed Follow-up on a regular basis every 6 months Adult wellness-complete.wellness physical was conducted today. Importance of diet and exercise were discussed in detail.  Importance of stress reduction and healthy living were discussed.  In addition to this a discussion regarding safety was also covered.  We also reviewed over immunizations and gave recommendations regarding current immunization needed for age.   In addition to this additional areas were also touched on including: Preventative health exams needed:  Colonoscopy we did discuss the importance to get this done he has been previously referred he will follow through with scheduling  Patient was advised yearly wellness exam  It should be noted that his PSA is going up this is concerning we need to repeat this again in 6 months to be on the safe side and more than doubled

## 2022-04-02 ENCOUNTER — Telehealth: Payer: Self-pay | Admitting: Family Medicine

## 2022-04-02 NOTE — Telephone Encounter (Signed)
Pt calling in to make sure provider received biometric that he sent through my chart. Pt states he needs this by 04/06/22. Please advise. Thank you.

## 2022-04-03 NOTE — Telephone Encounter (Signed)
1.  I filled out this form 2.  Please forward this form back to [facility] via [place] I have no idea how to upload this Please call Christopher Buckley let him know the form is completed The bottom line is this form needs to get back to him today thank you

## 2022-04-03 NOTE — Telephone Encounter (Signed)
Form faxed to number on top of form and copy up front for patient pick up. Patient notified and verbalized understanding

## 2022-04-22 DIAGNOSIS — R1310 Dysphagia, unspecified: Secondary | ICD-10-CM | POA: Diagnosis not present

## 2022-04-22 DIAGNOSIS — R633 Feeding difficulties, unspecified: Secondary | ICD-10-CM | POA: Diagnosis not present

## 2022-04-22 DIAGNOSIS — R1312 Dysphagia, oropharyngeal phase: Secondary | ICD-10-CM | POA: Diagnosis not present

## 2022-04-24 NOTE — Telephone Encounter (Signed)
FYI nurses I filled out this form it was forwarded to the front and was handled by nurses in front this issue is resolved to my understanding thank you

## 2022-06-24 ENCOUNTER — Other Ambulatory Visit: Payer: Self-pay | Admitting: Family Medicine

## 2022-07-10 DIAGNOSIS — Z85810 Personal history of malignant neoplasm of tongue: Secondary | ICD-10-CM | POA: Diagnosis not present

## 2022-07-10 DIAGNOSIS — E039 Hypothyroidism, unspecified: Secondary | ICD-10-CM | POA: Diagnosis not present

## 2022-07-10 DIAGNOSIS — Z08 Encounter for follow-up examination after completed treatment for malignant neoplasm: Secondary | ICD-10-CM | POA: Diagnosis not present

## 2022-07-10 DIAGNOSIS — C029 Malignant neoplasm of tongue, unspecified: Secondary | ICD-10-CM | POA: Diagnosis not present

## 2022-08-15 ENCOUNTER — Other Ambulatory Visit: Payer: Self-pay | Admitting: Family Medicine

## 2022-09-16 ENCOUNTER — Telehealth: Payer: Self-pay | Admitting: *Deleted

## 2022-09-16 NOTE — Telephone Encounter (Signed)
Referring MD/PCP: Dr. Wolfgang Phoenix  Procedure: colonoscopy  Reason/Indication:  screening  Has patient had this procedure before?  no  If so, when, by whom and where?    Is there a family history of colon cancer?  no  Who?  What age when diagnosed?    Is patient diabetic? If yes, Type 1 or Type 2   no      Does patient have prosthetic heart valve or mechanical valve?  no  Do you have a pacemaker/defibrillator?  no  Has patient ever had endocarditis/atrial fibrillation? no  Does patient use oxygen? no  Has patient had joint replacement within last 12 months?  no  Is patient constipated or do they take laxatives? no  Does patient have a history of alcohol/drug use?  no  Have you had a stroke/heart attack last 6 mths? no  Do you take medicine for weight loss?  no  Is patient on blood thinner such as Coumadin, Plavix and/or Aspirin? no  Medications:  Current Outpatient Medications on File Prior to Visit  Medication Sig Dispense Refill   amLODipine (NORVASC) 5 MG tablet Take 1 tablet (5 mg total) by mouth daily. 90 tablet 1   Coenzyme Q10 100 MG capsule Take 100 mg by mouth.     doxazosin (CARDURA) 2 MG tablet TAKE 1 TABLET BY MOUTH EVERYDAY AT BEDTIME 90 tablet 1   levothyroxine (SYNTHROID) 88 MCG tablet Take 1 tablet (88 mcg total) by mouth daily. 90 tablet 3   lisinopril-hydrochlorothiazide (ZESTORETIC) 20-25 MG tablet TAKE 1 TABLET BY MOUTH EVERY DAY 90 tablet 1   loratadine (CLARITIN) 10 MG tablet Take 10 mg by mouth daily.     Multiple Vitamin (MULTI-VITAMINS) TABS Take by mouth.     No current facility-administered medications on file prior to visit.     Allergies: No Known Allergies   [facility] [address]

## 2022-09-16 NOTE — Telephone Encounter (Signed)
Any room, Thanks 

## 2022-09-23 ENCOUNTER — Telehealth (INDEPENDENT_AMBULATORY_CARE_PROVIDER_SITE_OTHER): Payer: Self-pay | Admitting: *Deleted

## 2022-09-23 ENCOUNTER — Encounter (INDEPENDENT_AMBULATORY_CARE_PROVIDER_SITE_OTHER): Payer: Self-pay | Admitting: *Deleted

## 2022-09-23 DIAGNOSIS — E039 Hypothyroidism, unspecified: Secondary | ICD-10-CM | POA: Diagnosis not present

## 2022-09-23 DIAGNOSIS — I10 Essential (primary) hypertension: Secondary | ICD-10-CM | POA: Diagnosis not present

## 2022-09-23 DIAGNOSIS — Z1211 Encounter for screening for malignant neoplasm of colon: Secondary | ICD-10-CM

## 2022-09-23 DIAGNOSIS — E782 Mixed hyperlipidemia: Secondary | ICD-10-CM | POA: Diagnosis not present

## 2022-09-23 DIAGNOSIS — R7301 Impaired fasting glucose: Secondary | ICD-10-CM | POA: Diagnosis not present

## 2022-09-23 MED ORDER — NA SULFATE-K SULFATE-MG SULF 17.5-3.13-1.6 GM/177ML PO SOLN: ORAL | 0 refills | Status: DC

## 2022-09-23 NOTE — Telephone Encounter (Signed)
Received call from pt. He needed to reschedule his 2/9 procedure. He has been moved to 2/16. Aware will send new instructions.

## 2022-09-23 NOTE — Addendum Note (Signed)
Addended by: Cheron Every on: 09/23/2022 09:45 AM   Modules accepted: Orders

## 2022-09-23 NOTE — Telephone Encounter (Signed)
Referral completed

## 2022-09-23 NOTE — Telephone Encounter (Signed)
Spoke with pt. Scheduled for 2/9 at 12:30pm. Aware [facility] Instructions to be mailed. Insurance is [employer]

## 2022-09-24 LAB — HEMOGLOBIN A1C
Est. average glucose Bld gHb Est-mCnc: 120 mg/dL
Hgb A1c MFr Bld: 5.8 % — ABNORMAL HIGH (ref 4.8–5.6)

## 2022-09-24 LAB — LIPID PANEL
Chol/HDL Ratio: 5.1 ratio — ABNORMAL HIGH (ref 0.0–5.0)
Cholesterol, Total: 192 mg/dL (ref 100–199)
HDL: 38 mg/dL — ABNORMAL LOW (ref >39)
LDL Chol Calc (NIH): 113 mg/dL — ABNORMAL HIGH (ref 0–99)
Triglycerides: 233 mg/dL — ABNORMAL HIGH (ref 0–149)
VLDL Cholesterol Cal: 41 mg/dL — ABNORMAL HIGH (ref 5–40)

## 2022-09-24 LAB — BASIC METABOLIC PANEL
BUN/Creatinine Ratio: 10 (ref 9–20)
BUN: 9 mg/dL (ref 6–24)
CO2: 23 mmol/L (ref 20–29)
Calcium: 9.5 mg/dL (ref 8.7–10.2)
Chloride: 102 mmol/L (ref 96–106)
Creatinine, Ser: 0.93 mg/dL (ref 0.76–1.27)
Glucose: 123 mg/dL — ABNORMAL HIGH (ref 70–99)
Potassium: 4.2 mmol/L (ref 3.5–5.2)
Sodium: 138 mmol/L (ref 134–144)
eGFR: 99 mL/min/{1.73_m2} (ref >59)

## 2022-09-24 LAB — TSH+FREE T4
Free T4: 1.2 ng/dL (ref 0.82–1.77)
TSH: 4.47 u[IU]/mL (ref 0.450–4.500)

## 2022-09-25 ENCOUNTER — Telehealth: Payer: Self-pay | Admitting: *Deleted

## 2022-09-25 NOTE — Telephone Encounter (Signed)
Called pt insurance [employer] [phone] and was advised I would be contacted regarding if PA is required.

## 2022-09-28 NOTE — Telephone Encounter (Signed)
Received fax back no PA is required for procedure

## 2022-10-02 ENCOUNTER — Encounter: Payer: Self-pay | Admitting: Family Medicine

## 2022-10-02 ENCOUNTER — Ambulatory Visit (INDEPENDENT_AMBULATORY_CARE_PROVIDER_SITE_OTHER): Payer: BC Managed Care – PPO | Admitting: Family Medicine

## 2022-10-02 VITALS — BP 120/77 | HR 96 | Temp 97.7°F | Ht 77.0 in | Wt 257.0 lb

## 2022-10-02 DIAGNOSIS — C029 Malignant neoplasm of tongue, unspecified: Secondary | ICD-10-CM

## 2022-10-02 DIAGNOSIS — E782 Mixed hyperlipidemia: Secondary | ICD-10-CM | POA: Diagnosis not present

## 2022-10-02 DIAGNOSIS — E039 Hypothyroidism, unspecified: Secondary | ICD-10-CM | POA: Diagnosis not present

## 2022-10-02 DIAGNOSIS — I10 Essential (primary) hypertension: Secondary | ICD-10-CM

## 2022-10-02 MED ORDER — LEVOTHYROXINE SODIUM 88 MCG PO TABS: 88.0000 ug | ORAL_TABLET | Freq: Every day | ORAL | 3 refills | Status: DC

## 2022-10-02 MED ORDER — AMLODIPINE BESYLATE 5 MG PO TABS: 5.0000 mg | ORAL_TABLET | Freq: Every day | ORAL | 1 refills | Status: DC

## 2022-10-02 MED ORDER — DOXAZOSIN MESYLATE 2 MG PO TABS: ORAL_TABLET | ORAL | 1 refills | Status: DC

## 2022-10-02 NOTE — Progress Notes (Unsigned)
   Subjective:    Patient ID: Christopher Buckley, male    DOB: 01/21/1971, 52 y.o.   MRN: [id number]  HPI 6 month follow up hypothyroidism , hypertension Refill all needed medication - received a short script from pharmacy for thyroid medication He relates taking his medicines He does not use tobacco Is almost 5 years out from his neck cancer /Tongue throat cancer Doing well No swallowing difficulties no chest tightness pressure pain shortness of breath  Review of Systems     Objective:   Physical Exam General-in no acute distress Eyes-no discharge Lungs-respiratory rate normal, CTA CV-no murmurs,RRR Extremities skin warm dry no edema Neuro grossly normal Behavior normal, alert        Assessment & Plan:  HTN good control No sign of head or neck cancer going on currently Mild overweight portion control regular activity recommended Prediabetes minimize starches stay physically active Erectile dysfunction currently using sildenafil 20 mg if he would like to try the 100 mg sildenafil or Cialis he will let us know Hyperlipidemia continue medication watch diet stay active Follow-up for complete checkup wellness in the summer No sign of reoccurrence of head and neck cancer.  Almost 5 years out

## 2022-10-03 DIAGNOSIS — C029 Malignant neoplasm of tongue, unspecified: Secondary | ICD-10-CM | POA: Insufficient documentation

## 2022-10-20 ENCOUNTER — Other Ambulatory Visit (HOSPITAL_COMMUNITY)
Admission: RE | Admit: 2022-10-20 | Discharge: 2022-10-20 | Disposition: A | Discharge status: Home / Self Care | Payer: BC Managed Care – PPO | Source: Ambulatory Visit | Attending: Gastroenterology | Admitting: Gastroenterology

## 2022-10-20 DIAGNOSIS — Z1211 Encounter for screening for malignant neoplasm of colon: Secondary | ICD-10-CM | POA: Insufficient documentation

## 2022-10-20 DIAGNOSIS — Z87891 Personal history of nicotine dependence: Secondary | ICD-10-CM | POA: Diagnosis not present

## 2022-10-20 DIAGNOSIS — I10 Essential (primary) hypertension: Secondary | ICD-10-CM | POA: Diagnosis not present

## 2022-10-20 DIAGNOSIS — D124 Benign neoplasm of descending colon: Secondary | ICD-10-CM | POA: Diagnosis not present

## 2022-10-20 DIAGNOSIS — E039 Hypothyroidism, unspecified: Secondary | ICD-10-CM | POA: Diagnosis not present

## 2022-10-20 DIAGNOSIS — K648 Other hemorrhoids: Secondary | ICD-10-CM | POA: Diagnosis not present

## 2022-10-20 DIAGNOSIS — K5730 Diverticulosis of large intestine without perforation or abscess without bleeding: Secondary | ICD-10-CM | POA: Diagnosis not present

## 2022-10-20 DIAGNOSIS — K644 Residual hemorrhoidal skin tags: Secondary | ICD-10-CM | POA: Diagnosis not present

## 2022-10-20 DIAGNOSIS — Z09 Encounter for follow-up examination after completed treatment for conditions other than malignant neoplasm: Secondary | ICD-10-CM | POA: Diagnosis not present

## 2022-10-20 LAB — BASIC METABOLIC PANEL
Anion gap: 12 (ref 5–15)
BUN: 16 mg/dL (ref 6–20)
CO2: 25 mmol/L (ref 22–32)
Calcium: 9.1 mg/dL (ref 8.9–10.3)
Chloride: 98 mmol/L (ref 98–111)
Creatinine, Ser: 0.94 mg/dL (ref 0.61–1.24)
GFR, Estimated: >60 mL/min (ref >60)
Glucose, Bld: 114 mg/dL — ABNORMAL HIGH (ref 70–99)
Potassium: 3.5 mmol/L (ref 3.5–5.1)
Sodium: 135 mmol/L (ref 135–145)

## 2022-10-23 ENCOUNTER — Ambulatory Visit (HOSPITAL_COMMUNITY): Payer: BC Managed Care – PPO | Admitting: Anesthesiology

## 2022-10-23 ENCOUNTER — Other Ambulatory Visit: Payer: Self-pay

## 2022-10-23 ENCOUNTER — Encounter (HOSPITAL_COMMUNITY)
Admission: RE | Disposition: A | Discharge status: Home / Self Care | Payer: Self-pay | Source: Home / Self Care | Attending: Gastroenterology

## 2022-10-23 ENCOUNTER — Encounter (HOSPITAL_COMMUNITY): Payer: Self-pay | Admitting: Gastroenterology

## 2022-10-23 ENCOUNTER — Ambulatory Visit (HOSPITAL_COMMUNITY)
Admission: RE | Admit: 2022-10-23 | Discharge: 2022-10-23 | Disposition: A | Discharge status: Home / Self Care | Payer: BC Managed Care – PPO | Attending: Gastroenterology | Admitting: Gastroenterology

## 2022-10-23 DIAGNOSIS — E039 Hypothyroidism, unspecified: Secondary | ICD-10-CM | POA: Insufficient documentation

## 2022-10-23 DIAGNOSIS — D124 Benign neoplasm of descending colon: Secondary | ICD-10-CM | POA: Diagnosis not present

## 2022-10-23 DIAGNOSIS — Z1211 Encounter for screening for malignant neoplasm of colon: Secondary | ICD-10-CM | POA: Diagnosis not present

## 2022-10-23 DIAGNOSIS — K5730 Diverticulosis of large intestine without perforation or abscess without bleeding: Secondary | ICD-10-CM | POA: Insufficient documentation

## 2022-10-23 DIAGNOSIS — I10 Essential (primary) hypertension: Secondary | ICD-10-CM | POA: Diagnosis not present

## 2022-10-23 DIAGNOSIS — K635 Polyp of colon: Secondary | ICD-10-CM

## 2022-10-23 DIAGNOSIS — K648 Other hemorrhoids: Secondary | ICD-10-CM | POA: Insufficient documentation

## 2022-10-23 DIAGNOSIS — Z09 Encounter for follow-up examination after completed treatment for conditions other than malignant neoplasm: Secondary | ICD-10-CM | POA: Insufficient documentation

## 2022-10-23 DIAGNOSIS — Z139 Encounter for screening, unspecified: Secondary | ICD-10-CM | POA: Diagnosis not present

## 2022-10-23 DIAGNOSIS — Z87891 Personal history of nicotine dependence: Secondary | ICD-10-CM | POA: Insufficient documentation

## 2022-10-23 DIAGNOSIS — K644 Residual hemorrhoidal skin tags: Secondary | ICD-10-CM | POA: Diagnosis not present

## 2022-10-23 HISTORY — DX: Malignant (primary) neoplasm, unspecified: C80.1

## 2022-10-23 HISTORY — PX: COLONOSCOPY WITH PROPOFOL: SHX5780

## 2022-10-23 LAB — HM COLONOSCOPY

## 2022-10-23 SURGERY — COLONOSCOPY WITH PROPOFOL
Anesthesia: General

## 2022-10-23 MED ORDER — PROPOFOL 10 MG/ML IV BOLUS
INTRAVENOUS | Status: DC | PRN
  Administered 2022-10-23: 100 mg via INTRAVENOUS
  Administered 2022-10-23 (×2): 50 mg via INTRAVENOUS

## 2022-10-23 MED ORDER — LIDOCAINE 2% (20 MG/ML) 5 ML SYRINGE
INTRAMUSCULAR | Status: DC | PRN
  Administered 2022-10-23: 50 mg via INTRAVENOUS

## 2022-10-23 MED ORDER — DEXMEDETOMIDINE HCL IN NACL 80 MCG/20ML IV SOLN
INTRAVENOUS | Status: AC
  Filled 2022-10-23: qty 20

## 2022-10-23 MED ORDER — DEXMEDETOMIDINE HCL IN NACL 80 MCG/20ML IV SOLN
INTRAVENOUS | Status: DC | PRN
  Administered 2022-10-23 (×2): 8 ug via BUCCAL
  Administered 2022-10-23: 12 ug via BUCCAL

## 2022-10-23 MED ORDER — PROPOFOL 500 MG/50ML IV EMUL
INTRAVENOUS | Status: DC | PRN
  Administered 2022-10-23: 150 ug/kg/min via INTRAVENOUS

## 2022-10-23 MED ORDER — LACTATED RINGERS IV SOLN
INTRAVENOUS | Status: DC
  Administered 2022-10-23: 1000 mL via INTRAVENOUS

## 2022-10-23 NOTE — Transfer of Care (Signed)
Immediate Anesthesia Transfer of Care Note  Patient: Christopher Buckley  Procedure(s) Performed: COLONOSCOPY WITH PROPOFOL  Patient Location: Endoscopy Unit  Anesthesia Type:General  Level of Consciousness: awake, alert , and oriented  Airway & Oxygen Therapy: Patient Spontanous Breathing  Post-op Assessment: Report given to RN and Post -op Vital signs reviewed and stable  Post vital signs: Reviewed and stable  Last Vitals:  Vitals Value Taken Time  BP    Temp    Pulse    Resp    SpO2      Last Pain:  Vitals:   10/23/22 0729  TempSrc: Oral  PainSc: 0-No pain      Patients Stated Pain Goal: 9 ([id number])  Complications: No notable events documented.

## 2022-10-23 NOTE — Op Note (Signed)
[facility] Patient Name: Christopher Buckley Procedure Date: 10/23/2022 8:52 AM MRN: [id number] Date of Birth: 22-Apr-1971 Attending MD: Maylon Peppers , , [id number] CSN: [id number] Age: 52 Admit Type: Outpatient Procedure:                Colonoscopy Indications:              Screening for colorectal malignant neoplasm Providers:                Maylon Peppers, Lurline Del, RN, Ladoris Gene                            Technician, Technician Referring MD:              Medicines:                Monitored Anesthesia Care Complications:            No immediate complications. Estimated Blood Loss:     Estimated blood loss: none. Procedure:                Pre-Anesthesia Assessment:                           - Prior to the procedure, a History and Physical                            was performed, and patient medications, allergies                            and sensitivities were reviewed. The patient's                            tolerance of previous anesthesia was reviewed.                           - The risks and benefits of the procedure and the                            sedation options and risks were discussed with the                            patient. All questions were answered and informed                            consent was obtained.                           - ASA Grade Assessment: II - A patient with mild                            systemic disease.                           After obtaining informed consent, the colonoscope                            was passed under direct vision. Throughout the  procedure, the patient's blood pressure, pulse, and                            oxygen saturations were monitored continuously. The                            [id number]-[id number] scope was introduced through                            the anus and advanced to the the cecum, identified                            by appendiceal orifice and ileocecal  valve. The                            colonoscopy was performed without difficulty. The                            patient tolerated the procedure well. The quality                            of the bowel preparation was good. Scope In: 19:07:18 AM Scope Out: 9:31:53 AM Scope Withdrawal Time: 0 hours 11 minutes 26 seconds  Total Procedure Duration: 0 hours 24 minutes 35 seconds  Findings:      Hemorrhoids were found on perianal exam.      A few small-mouthed diverticula were found in the sigmoid colon and       descending colon.      A 3 mm polyp was found in the descending colon. The polyp was sessile.       The polyp was removed with a cold snare. Resection and retrieval were       complete.      Non-bleeding external and internal hemorrhoids were found during       retroflexion and during perianal exam. The hemorrhoids were medium-sized. Impression:               - Hemorrhoids found on perianal exam.                           - Diverticulosis in the sigmoid colon and in the                            descending colon.                           - One 3 mm polyp in the descending colon, removed                            with a cold snare. Resected and retrieved.                           - Non-bleeding external and internal hemorrhoids. Moderate Sedation:      Per Anesthesia Care Recommendation:           - Discharge patient to home (ambulatory).                           -  Resume previous diet.                           - Await pathology results.                           - Repeat colonoscopy for surveillance based on                            pathology results. Procedure Code(s):        --- Professional ---                           [phone], Colonoscopy, flexible; with removal of                            tumor(s), polyp(s), or other lesion(s) by snare                            technique Diagnosis Code(s):        --- Professional ---                           Z12.11, Encounter  for screening for malignant                            neoplasm of colon                           K64.8, Other hemorrhoids                           D12.4, Benign neoplasm of descending colon                           K57.30, Diverticulosis of large intestine without                            perforation or abscess without bleeding CPT copyright 2022 American [employer]. All rights reserved. The codes documented in this report are preliminary and upon coder review may  be revised to meet current compliance requirements. Maylon Peppers, MD Maylon Peppers,  10/23/2022 9:34:55 AM This report has been signed electronically. Number of Addenda: 0

## 2022-10-23 NOTE — Discharge Instructions (Signed)
You are being discharged to home.  Resume your previous diet.  We are waiting for your pathology results.  Your physician has recommended a repeat colonoscopy for surveillance based on pathology results.  

## 2022-10-23 NOTE — Anesthesia Preprocedure Evaluation (Signed)
Anesthesia Evaluation  Patient identified by MRN, date of birth, ID band Patient awake    Reviewed: Allergy & Precautions, H&P , NPO status , Patient's Chart, lab work & pertinent test results, reviewed documented beta blocker date and time   Airway Mallampati: II  TM Distance: >3 FB Neck ROM: full    Dental no notable dental hx.    Pulmonary neg pulmonary ROS, former smoker   Pulmonary exam normal breath sounds clear to auscultation       Cardiovascular Exercise Tolerance: Good hypertension, negative cardio ROS  Rhythm:regular Rate:Normal     Neuro/Psych negative neurological ROS  negative psych ROS   GI/Hepatic negative GI ROS, Neg liver ROS,,,  Endo/Other  negative endocrine ROSHypothyroidism    Renal/GU negative Renal ROS  negative genitourinary   Musculoskeletal   Abdominal   Peds  Hematology negative hematology ROS (+)   Anesthesia Other Findings   Reproductive/Obstetrics negative OB ROS                             Anesthesia Physical Anesthesia Plan  ASA: 2  Anesthesia Plan: General   Post-op Pain Management:    Induction:   PONV Risk Score and Plan: Propofol infusion  Airway Management Planned:   Additional Equipment:   Intra-op Plan:   Post-operative Plan:   Informed Consent: I have reviewed the patients History and Physical, chart, labs and discussed the procedure including the risks, benefits and alternatives for the proposed anesthesia with the patient or authorized representative who has indicated his/her understanding and acceptance.     Dental Advisory Given  Plan Discussed with: CRNA  Anesthesia Plan Comments:        Anesthesia Quick Evaluation

## 2022-10-23 NOTE — H&P (Signed)
Christopher Buckley is an 52 y.o. male.   Chief Complaint: Colorectal cancer screening HPI: 52 year old male with past medical history of hypertension, hypothyroidism, coming for screening colonoscopy. The patient has never had a colonoscopy in the past.  The patient denies having any complaints such as melena, hematochezia, abdominal pain or distention, change in her bowel movement consistency or frequency, no changes in weight recently.  No family history of colorectal cancer.   Past Medical History:  Diagnosis Date   Allergy    Cancer ([place])    Hypertension    Hypothyroidism (acquired) 10/09/2020   Contributing factors neck radiation for oral cancer   Idiopathic thrombocytopenia ([place]) 2000    Past Surgical History:  Procedure Laterality Date   DEEP NECK LYMPH NODE BIOPSY / EXCISION Left    EXCISION OF TONGUE LESION     cancer    Family History  Problem Relation Age of Onset   Hypertension Father    Hyperlipidemia Father    Hypertension Paternal Grandmother    Social History:  reports that he has quit smoking. His smoking use included cigarettes. He quit smokeless tobacco use about 7 years ago.  His smokeless tobacco use included chew. He reports current alcohol use. He reports that he does not use drugs.  Allergies: No Known Allergies  Medications Prior to Admission  Medication Sig Dispense Refill   amLODipine (NORVASC) 5 MG tablet Take 1 tablet (5 mg total) by mouth daily. 90 tablet 1   Coenzyme Q10 100 MG capsule Take 100 mg by mouth.     doxazosin (CARDURA) 2 MG tablet TAKE 1 TABLET BY MOUTH EVERYDAY AT BEDTIME 90 tablet 1   levothyroxine (SYNTHROID) 88 MCG tablet Take 1 tablet (88 mcg total) by mouth daily. 90 tablet 3   lisinopril-hydrochlorothiazide (ZESTORETIC) 20-25 MG tablet TAKE 1 TABLET BY MOUTH EVERY DAY 90 tablet 1   loratadine (CLARITIN) 10 MG tablet Take 10 mg by mouth daily.     Multiple Vitamin (MULTI-VITAMINS) TABS Take by mouth.     Na Sulfate-K Sulfate-Mg  Sulf 17.5-3.13-1.6 GM/177ML SOLN As directed 354 mL 0    No results found for this or any previous visit (from the past 48 hour(s)). No results found.  Review of Systems  All other systems reviewed and are negative.   Pulse 80, temperature 98.3 F (36.8 C), temperature source Oral, resp. rate 13, height 6' 5"$  (1.956 m), weight 113.4 kg, SpO2 96 %. Physical Exam  GENERAL: The patient is AO x3, in no acute distress. HEENT: Head is normocephalic and atraumatic. EOMI are intact. Mouth is well hydrated and without lesions. NECK: Supple. No masses LUNGS: Clear to auscultation. No presence of rhonchi/wheezing/rales. Adequate chest expansion HEART: RRR, normal s1 and s2. ABDOMEN: Soft, nontender, no guarding, no peritoneal signs, and nondistended. BS +. No masses. EXTREMITIES: Without any cyanosis, clubbing, rash, lesions or edema. NEUROLOGIC: AOx3, no focal motor deficit. SKIN: no jaundice, no rashes  Assessment/Plan 52 year old male with past medical history of hypertension, hypothyroidism, coming for screening colonoscopy. The patient is at average risk for colorectal cancer.  We will proceed with colonoscopy today.   Harvel Quale, MD 10/23/2022, 8:03 AM

## 2022-10-24 NOTE — Anesthesia Postprocedure Evaluation (Signed)
Anesthesia Post Note  Patient: Christopher Buckley  Procedure(s) Performed: COLONOSCOPY WITH PROPOFOL  Patient location during evaluation: Phase II Anesthesia Type: General Level of consciousness: awake Pain management: pain level controlled Vital Signs Assessment: post-procedure vital signs reviewed and stable Respiratory status: spontaneous breathing and respiratory function stable Cardiovascular status: blood pressure returned to baseline and stable Postop Assessment: no headache and no apparent nausea or vomiting Anesthetic complications: no Comments: Late entry   No notable events documented.   Last Vitals:  Vitals:   10/23/22 0729 10/23/22 0936  BP:  110/77  Pulse: 80 78  Resp: 13 17  Temp: 36.8 C   SpO2: 96% 94%    Last Pain:  Vitals:   10/23/22 0941  TempSrc:   PainSc: 0-No pain                 [name]

## 2022-10-26 ENCOUNTER — Encounter (INDEPENDENT_AMBULATORY_CARE_PROVIDER_SITE_OTHER): Payer: Self-pay | Admitting: *Deleted

## 2022-10-26 LAB — SURGICAL PATHOLOGY

## 2022-10-29 ENCOUNTER — Encounter (HOSPITAL_COMMUNITY): Payer: Self-pay | Admitting: Gastroenterology

## 2022-12-14 DIAGNOSIS — D225 Melanocytic nevi of trunk: Secondary | ICD-10-CM | POA: Diagnosis not present

## 2022-12-14 DIAGNOSIS — D2261 Melanocytic nevi of right upper limb, including shoulder: Secondary | ICD-10-CM | POA: Diagnosis not present

## 2022-12-14 DIAGNOSIS — D485 Neoplasm of uncertain behavior of skin: Secondary | ICD-10-CM | POA: Diagnosis not present

## 2022-12-30 DIAGNOSIS — C029 Malignant neoplasm of tongue, unspecified: Secondary | ICD-10-CM | POA: Diagnosis not present

## 2023-02-08 ENCOUNTER — Other Ambulatory Visit: Payer: Self-pay | Admitting: Family Medicine

## 2023-03-31 ENCOUNTER — Other Ambulatory Visit: Payer: Self-pay

## 2023-03-31 DIAGNOSIS — Z0000 Encounter for general adult medical examination without abnormal findings: Secondary | ICD-10-CM

## 2023-03-31 DIAGNOSIS — I10 Essential (primary) hypertension: Secondary | ICD-10-CM

## 2023-03-31 DIAGNOSIS — Z79899 Other long term (current) drug therapy: Secondary | ICD-10-CM

## 2023-03-31 DIAGNOSIS — E039 Hypothyroidism, unspecified: Secondary | ICD-10-CM

## 2023-03-31 DIAGNOSIS — R7301 Impaired fasting glucose: Secondary | ICD-10-CM

## 2023-03-31 DIAGNOSIS — E782 Mixed hyperlipidemia: Secondary | ICD-10-CM

## 2023-04-01 DIAGNOSIS — R7301 Impaired fasting glucose: Secondary | ICD-10-CM | POA: Diagnosis not present

## 2023-04-01 DIAGNOSIS — Z0000 Encounter for general adult medical examination without abnormal findings: Secondary | ICD-10-CM | POA: Diagnosis not present

## 2023-04-01 DIAGNOSIS — E039 Hypothyroidism, unspecified: Secondary | ICD-10-CM | POA: Diagnosis not present

## 2023-04-01 DIAGNOSIS — Z79899 Other long term (current) drug therapy: Secondary | ICD-10-CM | POA: Diagnosis not present

## 2023-04-01 DIAGNOSIS — E782 Mixed hyperlipidemia: Secondary | ICD-10-CM | POA: Diagnosis not present

## 2023-04-01 DIAGNOSIS — I10 Essential (primary) hypertension: Secondary | ICD-10-CM | POA: Diagnosis not present

## 2023-04-02 LAB — LIPID PANEL: VLDL Cholesterol Cal: 23 mg/dL (ref 5–40)

## 2023-04-05 ENCOUNTER — Encounter: Payer: Self-pay | Admitting: Family Medicine

## 2023-04-05 ENCOUNTER — Ambulatory Visit (INDEPENDENT_AMBULATORY_CARE_PROVIDER_SITE_OTHER): Payer: BC Managed Care – PPO | Admitting: Family Medicine

## 2023-04-05 VITALS — BP 132/86 | HR 66 | Temp 97.9°F | Ht 77.0 in | Wt 259.0 lb

## 2023-04-05 DIAGNOSIS — Z23 Encounter for immunization: Secondary | ICD-10-CM | POA: Diagnosis not present

## 2023-04-05 DIAGNOSIS — Z0000 Encounter for general adult medical examination without abnormal findings: Secondary | ICD-10-CM

## 2023-04-05 MED ORDER — DOXAZOSIN MESYLATE 2 MG PO TABS: ORAL_TABLET | ORAL | 1 refills | Status: DC

## 2023-04-05 MED ORDER — AMLODIPINE BESYLATE 5 MG PO TABS: 5.0000 mg | ORAL_TABLET | Freq: Every day | ORAL | 1 refills | Status: DC

## 2023-04-05 NOTE — Progress Notes (Signed)
   Subjective:    Patient ID: Christopher Buckley, male    DOB: May 31, 1971, 52 y.o.   MRN: [id number]  HPI The patient comes in today for a wellness visit.  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  A review of medications was also completed.  Any needed refills; none  Eating habits: good expect for past 2 months  Falls/  MVA accidents in past few months: none  Regular exercise: walking, lifting weights  Specialist pt sees on regular basis: oncologist for head and neck Dr Melinda Crutch  Preventative health issues were discussed.   Additional concerns: form completed    Review of Systems     Objective:   Physical Exam General-in no acute distress Eyes-no discharge Lungs-respiratory rate normal, CTA CV-no murmurs,RRR Extremities skin warm dry no edema Neuro grossly normal Behavior normal, alert Prostate exam normal Head and neck exam no nodules felt       Assessment & Plan:   1. Immunization due Today - Tdap vaccine greater than or equal to 7yo IM  2. Annual physical exam Adult wellness-complete.wellness physical was conducted today. Importance of diet and exercise were discussed in detail.  Importance of stress reduction and healthy living were discussed.  In addition to this a discussion regarding safety was also covered.  We also reviewed over immunizations and gave recommendations regarding current immunization needed for age.   In addition to this additional areas were also touched on including: Preventative health exams needed:  Colonoscopy 2031  Patient was advised yearly wellness exam  - Tdap vaccine greater than or equal to 7yo IM  Patient does have mild elevation of LDL  Patient with prediabetes A1c gone up  slightly He will do his best to watch starches in his diet Continue medications.  Follow-up within 6 months.  Minimize salt in diet.  Regular physical activity.  Try to bring weight down a few pounds if possible

## 2023-08-06 ENCOUNTER — Other Ambulatory Visit: Payer: Self-pay | Admitting: Family Medicine

## 2023-09-06 ENCOUNTER — Other Ambulatory Visit: Payer: Self-pay | Admitting: Family Medicine

## 2023-09-23 ENCOUNTER — Other Ambulatory Visit: Payer: Self-pay | Admitting: Family Medicine

## 2023-10-06 ENCOUNTER — Ambulatory Visit (INDEPENDENT_AMBULATORY_CARE_PROVIDER_SITE_OTHER): Payer: BC Managed Care – PPO | Admitting: Family Medicine

## 2023-10-06 VITALS — BP 128/88 | HR 72 | Temp 98.2°F | Ht 77.0 in | Wt 263.6 lb

## 2023-10-06 DIAGNOSIS — E039 Hypothyroidism, unspecified: Secondary | ICD-10-CM | POA: Diagnosis not present

## 2023-10-06 DIAGNOSIS — E782 Mixed hyperlipidemia: Secondary | ICD-10-CM | POA: Diagnosis not present

## 2023-10-06 NOTE — Progress Notes (Signed)
   Subjective:    Patient ID: Christopher Buckley, male    DOB: 01/19/1971, 53 y.o.   MRN: [id number]  HPI Patient for blood pressure check up.  The patient does have hypertension.   Patient relates dietary measures try to minimize salt The importance of healthy diet and activity were discussed Patient relates compliance  Patient with history of hyperlipidemia treating this with dietary measures.  We are following closely He is trying to do better job of eating healthier Tries to stay physically active Has not been as active recently Under fair amount of stress with his job    Review of Systems     Objective:   Physical Exam  General-in no acute distress Eyes-no discharge Lungs-respiratory rate normal, CTA CV-no murmurs,RRR Extremities skin warm dry no edema Neuro grossly normal Behavior normal, alert  Neck exam normal has scarring from his previous treatment of neck cancer no sign of recurrence of oral cancer Patient will do comprehensive lab work before follow-up    Assessment & Plan:  HTN- patient seen for follow-up regarding HTN.   Diet, medication compliance, appropriate labs and refills were completed.   Importance of keeping blood pressure under good control to lessen the risk of complications discussed Regular follow-up visits discussed  We discussed the importance of fitting and walking and healthy eating as a regular part of his routine to get blood pressure looking better ideally would like to get that diastolic near 80  Comprehensive lab work before follow-up visit to follow-up if progressive troubles or problems  No sign of reoccurrence of head or neck cancer

## 2023-10-12 ENCOUNTER — Telehealth: Payer: Self-pay | Admitting: Family Medicine

## 2023-10-12 NOTE — Telephone Encounter (Signed)
 Nurses  Patient has a wellness visit in July Please order A1c, lipid, liver, metabolic 7, TSH, free T4, urine ACR, PSA  Prostate cancer screening, prediabetes, hyperlipidemia, hypertension, hypothyroidism, wellness  He is aware of his blood work he will do these before his visit in late July thank you

## 2023-10-13 ENCOUNTER — Other Ambulatory Visit: Payer: Self-pay

## 2023-10-13 DIAGNOSIS — R7303 Prediabetes: Secondary | ICD-10-CM

## 2023-10-13 DIAGNOSIS — E039 Hypothyroidism, unspecified: Secondary | ICD-10-CM

## 2023-10-13 DIAGNOSIS — Z125 Encounter for screening for malignant neoplasm of prostate: Secondary | ICD-10-CM

## 2023-10-13 DIAGNOSIS — E782 Mixed hyperlipidemia: Secondary | ICD-10-CM

## 2023-10-13 DIAGNOSIS — Z79899 Other long term (current) drug therapy: Secondary | ICD-10-CM

## 2023-10-13 DIAGNOSIS — I10 Essential (primary) hypertension: Secondary | ICD-10-CM

## 2023-10-26 ENCOUNTER — Other Ambulatory Visit: Payer: Self-pay | Admitting: Family Medicine

## 2024-02-02 ENCOUNTER — Other Ambulatory Visit: Payer: Self-pay | Admitting: Family Medicine

## 2024-02-02 ENCOUNTER — Other Ambulatory Visit: Payer: Self-pay

## 2024-02-02 MED ORDER — LISINOPRIL-HYDROCHLOROTHIAZIDE 20-25 MG PO TABS: 1.0000 | ORAL_TABLET | Freq: Every day | ORAL | 2 refills | Status: DC

## 2024-02-02 MED ORDER — DOXAZOSIN MESYLATE 2 MG PO TABS: ORAL_TABLET | ORAL | 2 refills | Status: DC

## 2024-02-07 DIAGNOSIS — C029 Malignant neoplasm of tongue, unspecified: Secondary | ICD-10-CM | POA: Diagnosis not present

## 2024-03-24 ENCOUNTER — Other Ambulatory Visit: Payer: Self-pay | Admitting: Family Medicine

## 2024-03-28 DIAGNOSIS — E782 Mixed hyperlipidemia: Secondary | ICD-10-CM | POA: Diagnosis not present

## 2024-03-28 DIAGNOSIS — E039 Hypothyroidism, unspecified: Secondary | ICD-10-CM | POA: Diagnosis not present

## 2024-03-28 DIAGNOSIS — R7303 Prediabetes: Secondary | ICD-10-CM | POA: Diagnosis not present

## 2024-03-28 DIAGNOSIS — Z125 Encounter for screening for malignant neoplasm of prostate: Secondary | ICD-10-CM | POA: Diagnosis not present

## 2024-03-28 DIAGNOSIS — I10 Essential (primary) hypertension: Secondary | ICD-10-CM | POA: Diagnosis not present

## 2024-03-28 DIAGNOSIS — Z79899 Other long term (current) drug therapy: Secondary | ICD-10-CM | POA: Diagnosis not present

## 2024-03-29 ENCOUNTER — Ambulatory Visit: Payer: Self-pay | Admitting: Family Medicine

## 2024-03-29 LAB — HEPATIC FUNCTION PANEL
ALT: 29 IU/L (ref 0–44)
AST: 17 IU/L (ref 0–40)
Albumin: 4.8 g/dL (ref 3.8–4.9)
Alkaline Phosphatase: 69 IU/L (ref 44–121)
Bilirubin Total: 1.2 mg/dL (ref 0.0–1.2)
Bilirubin, Direct: 0.33 mg/dL (ref 0.00–0.40)
Total Protein: 6.9 g/dL (ref 6.0–8.5)

## 2024-03-29 LAB — BASIC METABOLIC PANEL WITH GFR
BUN/Creatinine Ratio: 11 (ref 9–20)
BUN: 10 mg/dL (ref 6–24)
CO2: 23 mmol/L (ref 20–29)
Calcium: 9.9 mg/dL (ref 8.7–10.2)
Chloride: 96 mmol/L (ref 96–106)
Creatinine, Ser: 0.94 mg/dL (ref 0.76–1.27)
Glucose: 109 mg/dL — ABNORMAL HIGH (ref 70–99)
Potassium: 3.9 mmol/L (ref 3.5–5.2)
Sodium: 134 mmol/L (ref 134–144)
eGFR: 98 mL/min/1.73 (ref >59)

## 2024-03-29 LAB — HEMOGLOBIN A1C
Est. average glucose Bld gHb Est-mCnc: 117 mg/dL
Hgb A1c MFr Bld: 5.7 % — ABNORMAL HIGH (ref 4.8–5.6)

## 2024-03-29 LAB — LIPID PANEL
Chol/HDL Ratio: 4.4 ratio (ref 0.0–5.0)
Cholesterol, Total: 207 mg/dL — ABNORMAL HIGH (ref 100–199)
HDL: 47 mg/dL (ref >39)
LDL Chol Calc (NIH): 130 mg/dL — ABNORMAL HIGH (ref 0–99)
Triglycerides: 170 mg/dL — ABNORMAL HIGH (ref 0–149)
VLDL Cholesterol Cal: 30 mg/dL (ref 5–40)

## 2024-03-29 LAB — MICROALBUMIN / CREATININE URINE RATIO
Creatinine, Urine: 53.9 mg/dL
Microalb/Creat Ratio: <6 mg/g{creat} (ref 0–29)
Microalbumin, Urine: <3 ug/mL

## 2024-03-29 LAB — PSA: Prostate Specific Ag, Serum: 0.5 ng/mL (ref 0.0–4.0)

## 2024-03-29 LAB — TSH+FREE T4
Free T4: 1.24 ng/dL (ref 0.82–1.77)
TSH: 5.17 u[IU]/mL — ABNORMAL HIGH (ref 0.450–4.500)

## 2024-04-05 ENCOUNTER — Encounter: Payer: Self-pay | Admitting: Family Medicine

## 2024-04-05 ENCOUNTER — Ambulatory Visit (INDEPENDENT_AMBULATORY_CARE_PROVIDER_SITE_OTHER): Payer: BC Managed Care – PPO | Admitting: Family Medicine

## 2024-04-05 ENCOUNTER — Encounter: Payer: BC Managed Care – PPO | Admitting: Family Medicine

## 2024-04-05 VITALS — BP 118/82 | HR 80 | Temp 97.9°F | Ht 77.0 in | Wt 260.0 lb

## 2024-04-05 DIAGNOSIS — Z0001 Encounter for general adult medical examination with abnormal findings: Secondary | ICD-10-CM | POA: Diagnosis not present

## 2024-04-05 DIAGNOSIS — E782 Mixed hyperlipidemia: Secondary | ICD-10-CM | POA: Diagnosis not present

## 2024-04-05 DIAGNOSIS — I10 Essential (primary) hypertension: Secondary | ICD-10-CM

## 2024-04-05 DIAGNOSIS — E039 Hypothyroidism, unspecified: Secondary | ICD-10-CM

## 2024-04-05 DIAGNOSIS — Z0000 Encounter for general adult medical examination without abnormal findings: Secondary | ICD-10-CM

## 2024-04-05 DIAGNOSIS — R7303 Prediabetes: Secondary | ICD-10-CM

## 2024-04-05 MED ORDER — DOXAZOSIN MESYLATE 2 MG PO TABS: ORAL_TABLET | ORAL | 2 refills | Status: DC

## 2024-04-05 MED ORDER — LISINOPRIL-HYDROCHLOROTHIAZIDE 20-25 MG PO TABS: 1.0000 | ORAL_TABLET | Freq: Every day | ORAL | 2 refills | Status: DC

## 2024-04-05 MED ORDER — LEVOTHYROXINE SODIUM 100 MCG PO TABS: 100.0000 ug | ORAL_TABLET | Freq: Every day | ORAL | 1 refills | Status: DC

## 2024-04-05 MED ORDER — AMLODIPINE BESYLATE 5 MG PO TABS: 5.0000 mg | ORAL_TABLET | Freq: Every day | ORAL | 1 refills | Status: DC

## 2024-04-05 NOTE — Progress Notes (Signed)
   Subjective:    Patient ID: Christopher Buckley, male    DOB: October 07, 1970, 53 y.o.   MRN: [id number]  HPI The patient comes in today for a wellness visit.  Patient doing a good job with healthy eating Trying to stay physically active No longer smokes He quit years ago His head and neck cancer is now 6 years out may have released him He denies any setbacks or problems Stress levels relatively high with work but he is handling this well Tries to be safe with his driving and safe on his job  A review of their health history was completed.  A review of medications was also completed.  Any needed refills; n/a  Eating habits: normal diet   Falls/  MVA accidents in past few months: no   Regular exercise: not regularly   Specialist pt sees on regular basis: n/a  Preventative health issues were discussed.   Additional concerns: none    The 10-year ASCVD risk score (Arnett DK, et al., 2019) is: 4.8%   Values used to calculate the score:     Age: 30 years     Clincally relevant sex: Male     Is Non-Hispanic African American: No     Diabetic: No     Tobacco smoker: No     Systolic Blood Pressure: 118 mmHg     Is BP treated: Yes     HDL Cholesterol: 47 mg/dL     Total Cholesterol: 207 mg/dL   Review of Systems     Objective:   Physical Exam  General-in no acute distress Eyes-no discharge Lungs-respiratory rate normal, CTA CV-no murmurs,RRR Extremities skin warm dry no edema Neuro grossly normal Behavior normal, alert       Assessment & Plan:  1. Well adult exam (Primary) Adult wellness-complete.wellness physical was conducted today. Importance of diet and exercise were discussed in detail.  Importance of stress reduction and healthy living were discussed.  In addition to this a discussion regarding safety was also covered.  We also reviewed over immunizations and gave recommendations regarding current immunization needed for age.   In addition to this additional  areas were also touched on including: Preventative health exams needed:  Colonoscopy 2031  Patient was advised yearly wellness exam   2. Hypothyroidism (acquired) TSH slightly off adjust medication check lab work in 3 months follow-up 6 months - TSH + free T4  3. Mixed hyperlipidemia LDL slightly elevated but not in a dangerous range If this continues as a trend consider coronary calcium scan  4. Essential hypertension, benign HTN- patient seen for follow-up regarding HTN.   Diet, medication compliance, appropriate labs and refills were completed.   Importance of keeping blood pressure under good control to lessen the risk of complications discussed Regular follow-up visits discussed   5. Pre-diabetes Portion control cut back on carbohydrates stay physically active limit alcohol to no more than 2 drinks a day-beer  Follow-up 6 months  We did discuss lung cancer prevention this test can reduce the risk of lung cancer dying from 20 to 25% but also increases the risk of false positives along with additional testing including potential biopsies patient states balancing all of that he would prefer not to do this at this time

## 2024-06-21 ENCOUNTER — Encounter (INDEPENDENT_AMBULATORY_CARE_PROVIDER_SITE_OTHER): Payer: Self-pay | Admitting: Gastroenterology

## 2024-09-25 ENCOUNTER — Other Ambulatory Visit: Payer: Self-pay | Admitting: Family Medicine

## 2024-10-11 ENCOUNTER — Ambulatory Visit: Admitting: Family Medicine

## 2024-10-11 ENCOUNTER — Ambulatory Visit: Payer: Self-pay | Admitting: Family Medicine

## 2024-10-11 VITALS — BP 130/78 | HR 78 | Temp 98.2°F | Ht 77.0 in | Wt 264.0 lb

## 2024-10-11 DIAGNOSIS — Z79899 Other long term (current) drug therapy: Secondary | ICD-10-CM

## 2024-10-11 DIAGNOSIS — I10 Essential (primary) hypertension: Secondary | ICD-10-CM | POA: Diagnosis not present

## 2024-10-11 DIAGNOSIS — E039 Hypothyroidism, unspecified: Secondary | ICD-10-CM

## 2024-10-11 DIAGNOSIS — E782 Mixed hyperlipidemia: Secondary | ICD-10-CM

## 2024-10-11 DIAGNOSIS — R7303 Prediabetes: Secondary | ICD-10-CM

## 2024-10-11 LAB — TSH+FREE T4
Free T4: 1.26 ng/dL (ref 0.82–1.77)
TSH: 4.35 u[IU]/mL (ref 0.450–4.500)

## 2024-10-11 MED ORDER — LISINOPRIL-HYDROCHLOROTHIAZIDE 20-25 MG PO TABS: 1.0000 | ORAL_TABLET | Freq: Every day | ORAL | 2 refills | Status: AC

## 2024-10-11 MED ORDER — AMLODIPINE BESYLATE 5 MG PO TABS: 5.0000 mg | ORAL_TABLET | Freq: Every day | ORAL | 1 refills | Status: AC

## 2024-10-11 MED ORDER — DOXAZOSIN MESYLATE 2 MG PO TABS: ORAL_TABLET | ORAL | 2 refills | Status: AC

## 2024-10-11 NOTE — Progress Notes (Signed)
" ° °  Subjective:    Patient ID: Christopher Buckley, male    DOB: [birth year], 54 y.o.   MRN: [id number]  HPI 6 month follow up no concerns, recent labs Patient for blood pressure check up.  The patient does have hypertension.   Patient relates dietary measures try to minimize salt The importance of healthy diet and activity were discussed Patient relates compliance  Takes his thyroid  medicine regular basis states overall feels well with that recent lab work look good   Review of Systems     Objective:   Physical Exam General-in no acute distress Eyes-no discharge Lungs-respiratory rate normal, CTA CV-no murmurs,RRR Extremities skin warm dry no edema Neuro grossly normal Behavior normal, alert        Assessment & Plan:  History neck cancer absolutely no sign no ongoing cancer noted he is 6 years out no longer under the care of oncology Comprehensive lab work in the summer with follow-up visit HTN good blood pressure control Patient staying away from smoking He will continue to try to eat healthy Been under a lot of stress with his work lately but hopes this will improve with the better weather Takes his blood pressure medicine regular basis Will do comprehensive lab work before his next visit "

## 2025-04-12 ENCOUNTER — Encounter: Admitting: Family Medicine
